# Patient Record
Sex: Male | Born: 1955 | Race: White | Hispanic: No | Smoking: Never smoker
Health system: Southern US, Community
[De-identification: ages and names within clinical notes are randomized; demographics above are authoritative.]

## PROBLEM LIST (undated history)

## (undated) DIAGNOSIS — I10 Essential (primary) hypertension: Secondary | ICD-10-CM

## (undated) DIAGNOSIS — E785 Hyperlipidemia, unspecified: Secondary | ICD-10-CM

## (undated) HISTORY — PX: LAPAROSCOPIC INGUINAL HERNIA REPAIR: SUR788

## (undated) HISTORY — DX: Essential (primary) hypertension: I10

## (undated) HISTORY — DX: Hyperlipidemia, unspecified: E78.5

## (undated) HISTORY — PX: HERNIA REPAIR: SHX51

## (undated) HISTORY — PX: NASAL SINUS SURGERY: SHX719

---

## 2003-04-19 ENCOUNTER — Encounter: Payer: Self-pay | Admitting: Family Medicine

## 2003-04-19 ENCOUNTER — Encounter: Admission: RE | Admit: 2003-04-19 | Discharge: 2003-04-19 | Payer: Self-pay | Admitting: Family Medicine

## 2006-01-25 ENCOUNTER — Encounter: Admission: RE | Admit: 2006-01-25 | Discharge: 2006-01-25 | Payer: Self-pay | Admitting: Family Medicine

## 2006-07-29 ENCOUNTER — Ambulatory Visit: Payer: Self-pay | Admitting: Family Medicine

## 2006-09-06 ENCOUNTER — Ambulatory Visit: Payer: Self-pay | Admitting: Family Medicine

## 2006-09-06 LAB — CONVERTED CEMR LAB
ALT: 18 units/L (ref 0–40)
AST: 23 units/L (ref 0–37)
BUN: 11 mg/dL (ref 6–23)
CO2: 28 meq/L (ref 19–32)
Calcium: 9.4 mg/dL (ref 8.4–10.5)
Chloride: 106 meq/L (ref 96–112)
Chol/HDL Ratio, serum: 5.8
Cholesterol: 204 mg/dL (ref 0–200)
Creatinine, Ser: 1 mg/dL (ref 0.4–1.5)
GFR calc non Af Amer: 84 mL/min
Glomerular Filtration Rate, Af Am: 101 mL/min/{1.73_m2}
Glucose, Bld: 99 mg/dL (ref 70–99)
HDL: 35.4 mg/dL — ABNORMAL LOW (ref >39.0)
LDL DIRECT: 155.9 mg/dL
Potassium: 4.2 meq/L (ref 3.5–5.1)
Sodium: 141 meq/L (ref 135–145)
Triglyceride fasting, serum: 113 mg/dL (ref 0–149)
VLDL: 23 mg/dL (ref 0–40)

## 2006-12-27 ENCOUNTER — Ambulatory Visit: Payer: Self-pay | Admitting: Family Medicine

## 2006-12-27 LAB — CONVERTED CEMR LAB: PSA: 0.88 ng/mL (ref 0.10–4.00)

## 2007-01-13 DIAGNOSIS — I10 Essential (primary) hypertension: Secondary | ICD-10-CM

## 2007-01-13 HISTORY — DX: Essential (primary) hypertension: I10

## 2007-04-18 ENCOUNTER — Ambulatory Visit: Payer: Self-pay | Admitting: Family Medicine

## 2007-04-22 ENCOUNTER — Telehealth (INDEPENDENT_AMBULATORY_CARE_PROVIDER_SITE_OTHER): Payer: Self-pay | Admitting: *Deleted

## 2007-04-22 ENCOUNTER — Encounter (INDEPENDENT_AMBULATORY_CARE_PROVIDER_SITE_OTHER): Payer: Self-pay | Admitting: Family Medicine

## 2007-04-22 DIAGNOSIS — E785 Hyperlipidemia, unspecified: Secondary | ICD-10-CM

## 2007-04-22 HISTORY — DX: Hyperlipidemia, unspecified: E78.5

## 2007-04-22 LAB — CONVERTED CEMR LAB
ALT: 19 units/L (ref 0–53)
AST: 23 units/L (ref 0–37)
Cholesterol: 206 mg/dL (ref 0–200)
Direct LDL: 143.4 mg/dL
HDL: 26.5 mg/dL — ABNORMAL LOW (ref >39.0)
Total CHOL/HDL Ratio: 7.8
Triglycerides: 140 mg/dL (ref 0–149)
VLDL: 28 mg/dL (ref 0–40)

## 2007-05-09 ENCOUNTER — Encounter (INDEPENDENT_AMBULATORY_CARE_PROVIDER_SITE_OTHER): Payer: Self-pay | Admitting: *Deleted

## 2007-05-24 ENCOUNTER — Ambulatory Visit: Payer: Self-pay | Admitting: Family Medicine

## 2007-05-26 ENCOUNTER — Ambulatory Visit: Payer: Self-pay

## 2007-05-30 ENCOUNTER — Encounter (INDEPENDENT_AMBULATORY_CARE_PROVIDER_SITE_OTHER): Payer: Self-pay | Admitting: Family Medicine

## 2007-06-02 ENCOUNTER — Encounter (INDEPENDENT_AMBULATORY_CARE_PROVIDER_SITE_OTHER): Payer: Self-pay | Admitting: Family Medicine

## 2007-07-20 ENCOUNTER — Ambulatory Visit: Payer: Self-pay | Admitting: Family Medicine

## 2007-07-20 ENCOUNTER — Telehealth (INDEPENDENT_AMBULATORY_CARE_PROVIDER_SITE_OTHER): Payer: Self-pay | Admitting: *Deleted

## 2007-07-20 LAB — CONVERTED CEMR LAB
BUN: 10 mg/dL (ref 6–23)
CO2: 31 meq/L (ref 19–32)
Calcium: 9.3 mg/dL (ref 8.4–10.5)
Chloride: 104 meq/L (ref 96–112)
Cholesterol: 168 mg/dL (ref 0–200)
Creatinine, Ser: 0.9 mg/dL (ref 0.4–1.5)
GFR calc Af Amer: 114 mL/min
GFR calc non Af Amer: 95 mL/min
Glucose, Bld: 92 mg/dL (ref 70–99)
HDL: 32.3 mg/dL — ABNORMAL LOW (ref >39.0)
LDL Cholesterol: 113 mg/dL — ABNORMAL HIGH (ref 0–99)
Potassium: 4.1 meq/L (ref 3.5–5.1)
Sodium: 140 meq/L (ref 135–145)
Total CHOL/HDL Ratio: 5.2
Triglycerides: 112 mg/dL (ref 0–149)
VLDL: 22 mg/dL (ref 0–40)

## 2007-07-21 ENCOUNTER — Encounter (INDEPENDENT_AMBULATORY_CARE_PROVIDER_SITE_OTHER): Payer: Self-pay | Admitting: *Deleted

## 2007-07-21 ENCOUNTER — Telehealth (INDEPENDENT_AMBULATORY_CARE_PROVIDER_SITE_OTHER): Payer: Self-pay | Admitting: *Deleted

## 2007-08-22 ENCOUNTER — Telehealth (INDEPENDENT_AMBULATORY_CARE_PROVIDER_SITE_OTHER): Payer: Self-pay | Admitting: *Deleted

## 2007-09-27 ENCOUNTER — Ambulatory Visit: Payer: Self-pay | Admitting: Family Medicine

## 2007-09-28 LAB — CONVERTED CEMR LAB
BUN: 11 mg/dL (ref 6–23)
CO2: 30 meq/L (ref 19–32)
Calcium: 9.6 mg/dL (ref 8.4–10.5)
Chloride: 103 meq/L (ref 96–112)
Cholesterol: 160 mg/dL (ref 0–200)
Creatinine, Ser: 1 mg/dL (ref 0.4–1.5)
GFR calc Af Amer: 101 mL/min
GFR calc non Af Amer: 83 mL/min
Glucose, Bld: 92 mg/dL (ref 70–99)
HDL: 27.6 mg/dL — ABNORMAL LOW (ref >39.0)
LDL Cholesterol: 109 mg/dL — ABNORMAL HIGH (ref 0–99)
Potassium: 4.7 meq/L (ref 3.5–5.1)
Sodium: 139 meq/L (ref 135–145)
Total CHOL/HDL Ratio: 5.8
Triglycerides: 118 mg/dL (ref 0–149)
VLDL: 24 mg/dL (ref 0–40)

## 2007-09-29 ENCOUNTER — Encounter (INDEPENDENT_AMBULATORY_CARE_PROVIDER_SITE_OTHER): Payer: Self-pay | Admitting: *Deleted

## 2007-10-07 ENCOUNTER — Telehealth (INDEPENDENT_AMBULATORY_CARE_PROVIDER_SITE_OTHER): Payer: Self-pay | Admitting: *Deleted

## 2007-10-20 ENCOUNTER — Telehealth (INDEPENDENT_AMBULATORY_CARE_PROVIDER_SITE_OTHER): Payer: Self-pay | Admitting: *Deleted

## 2007-12-20 ENCOUNTER — Telehealth (INDEPENDENT_AMBULATORY_CARE_PROVIDER_SITE_OTHER): Payer: Self-pay | Admitting: *Deleted

## 2008-01-31 ENCOUNTER — Ambulatory Visit: Payer: Self-pay | Admitting: Internal Medicine

## 2008-01-31 DIAGNOSIS — M25519 Pain in unspecified shoulder: Secondary | ICD-10-CM | POA: Insufficient documentation

## 2008-01-31 HISTORY — DX: Pain in unspecified shoulder: M25.519

## 2008-02-06 LAB — CONVERTED CEMR LAB
ALT: 22 units/L (ref 0–53)
BUN: 11 mg/dL (ref 6–23)
Basophils Absolute: 0.1 10*3/uL (ref 0.0–0.1)
Basophils Relative: 1 % (ref 0.0–1.0)
CO2: 31 meq/L (ref 19–32)
Calcium: 9.5 mg/dL (ref 8.4–10.5)
Chloride: 107 meq/L (ref 96–112)
Cholesterol: 171 mg/dL (ref 0–200)
Creatinine, Ser: 0.9 mg/dL (ref 0.4–1.5)
Eosinophils Absolute: 0.3 10*3/uL (ref 0.0–0.7)
Eosinophils Relative: 4.2 % (ref 0.0–5.0)
GFR calc Af Amer: 114 mL/min
GFR calc non Af Amer: 94 mL/min
Glucose, Bld: 101 mg/dL — ABNORMAL HIGH (ref 70–99)
HCT: 45.6 % (ref 39.0–52.0)
HDL: 29.8 mg/dL — ABNORMAL LOW (ref >39.0)
Hemoglobin: 15.7 g/dL (ref 13.0–17.0)
LDL Cholesterol: 120 mg/dL — ABNORMAL HIGH (ref 0–99)
Lymphocytes Relative: 21.7 % (ref 12.0–46.0)
MCHC: 34.4 g/dL (ref 30.0–36.0)
MCV: 92 fL (ref 78.0–100.0)
Monocytes Absolute: 0.5 10*3/uL (ref 0.1–1.0)
Monocytes Relative: 6.5 % (ref 3.0–12.0)
Neutro Abs: 4.8 10*3/uL (ref 1.4–7.7)
Neutrophils Relative %: 66.6 % (ref 43.0–77.0)
PSA: 0.96 ng/mL (ref 0.10–4.00)
Platelets: 257 10*3/uL (ref 150–400)
Potassium: 4.8 meq/L (ref 3.5–5.1)
RBC: 4.95 M/uL (ref 4.22–5.81)
RDW: 12.7 % (ref 11.5–14.6)
Sodium: 142 meq/L (ref 135–145)
TSH: 2.15 microintl units/mL (ref 0.35–5.50)
Total CHOL/HDL Ratio: 5.7
Triglycerides: 105 mg/dL (ref 0–149)
VLDL: 21 mg/dL (ref 0–40)
WBC: 7.3 10*3/uL (ref 4.5–10.5)

## 2008-02-08 ENCOUNTER — Encounter: Admission: RE | Admit: 2008-02-08 | Discharge: 2008-04-03 | Payer: Self-pay | Admitting: Internal Medicine

## 2008-02-09 ENCOUNTER — Encounter: Payer: Self-pay | Admitting: Internal Medicine

## 2008-03-09 ENCOUNTER — Telehealth: Payer: Self-pay | Admitting: Internal Medicine

## 2008-04-03 ENCOUNTER — Encounter: Payer: Self-pay | Admitting: Internal Medicine

## 2008-06-19 ENCOUNTER — Ambulatory Visit: Payer: Self-pay | Admitting: Internal Medicine

## 2008-08-07 ENCOUNTER — Ambulatory Visit: Payer: Self-pay | Admitting: Internal Medicine

## 2009-01-17 ENCOUNTER — Encounter (INDEPENDENT_AMBULATORY_CARE_PROVIDER_SITE_OTHER): Payer: Self-pay | Admitting: *Deleted

## 2009-02-28 ENCOUNTER — Telehealth (INDEPENDENT_AMBULATORY_CARE_PROVIDER_SITE_OTHER): Payer: Self-pay | Admitting: *Deleted

## 2009-04-22 ENCOUNTER — Telehealth (INDEPENDENT_AMBULATORY_CARE_PROVIDER_SITE_OTHER): Payer: Self-pay | Admitting: *Deleted

## 2009-05-31 ENCOUNTER — Ambulatory Visit: Payer: Self-pay | Admitting: Internal Medicine

## 2009-06-03 ENCOUNTER — Encounter (INDEPENDENT_AMBULATORY_CARE_PROVIDER_SITE_OTHER): Payer: Self-pay | Admitting: *Deleted

## 2009-06-18 ENCOUNTER — Ambulatory Visit: Payer: Self-pay | Admitting: Internal Medicine

## 2009-06-24 ENCOUNTER — Encounter (INDEPENDENT_AMBULATORY_CARE_PROVIDER_SITE_OTHER): Payer: Self-pay | Admitting: *Deleted

## 2009-06-24 LAB — CONVERTED CEMR LAB: Fecal Occult Bld: NEGATIVE

## 2009-08-22 ENCOUNTER — Ambulatory Visit: Payer: Self-pay | Admitting: Internal Medicine

## 2009-08-23 ENCOUNTER — Telehealth (INDEPENDENT_AMBULATORY_CARE_PROVIDER_SITE_OTHER): Payer: Self-pay | Admitting: *Deleted

## 2009-11-29 ENCOUNTER — Ambulatory Visit: Payer: Self-pay | Admitting: Internal Medicine

## 2009-11-29 DIAGNOSIS — L989 Disorder of the skin and subcutaneous tissue, unspecified: Secondary | ICD-10-CM | POA: Insufficient documentation

## 2009-11-29 HISTORY — DX: Disorder of the skin and subcutaneous tissue, unspecified: L98.9

## 2009-12-26 ENCOUNTER — Encounter: Payer: Self-pay | Admitting: Internal Medicine

## 2010-04-24 ENCOUNTER — Ambulatory Visit: Payer: Self-pay | Admitting: Internal Medicine

## 2010-04-24 ENCOUNTER — Telehealth (INDEPENDENT_AMBULATORY_CARE_PROVIDER_SITE_OTHER): Payer: Self-pay | Admitting: *Deleted

## 2010-04-25 ENCOUNTER — Encounter: Payer: Self-pay | Admitting: Internal Medicine

## 2010-06-02 ENCOUNTER — Ambulatory Visit: Payer: Self-pay | Admitting: Internal Medicine

## 2010-06-04 LAB — CONVERTED CEMR LAB
ALT: 27 units/L (ref 0–53)
AST: 23 units/L (ref 0–37)
BUN: 15 mg/dL (ref 6–23)
Basophils Absolute: 0 10*3/uL (ref 0.0–0.1)
Basophils Relative: 0.7 % (ref 0.0–3.0)
CO2: 30 meq/L (ref 19–32)
Calcium: 9.6 mg/dL (ref 8.4–10.5)
Chloride: 103 meq/L (ref 96–112)
Cholesterol: 186 mg/dL (ref 0–200)
Creatinine, Ser: 1 mg/dL (ref 0.4–1.5)
Eosinophils Absolute: 0.2 10*3/uL (ref 0.0–0.7)
Eosinophils Relative: 2.7 % (ref 0.0–5.0)
GFR calc non Af Amer: 85.48 mL/min (ref >60)
Glucose, Bld: 96 mg/dL (ref 70–99)
HCT: 44.6 % (ref 39.0–52.0)
HDL: 31.5 mg/dL — ABNORMAL LOW (ref >39.00)
Hemoglobin: 15.6 g/dL (ref 13.0–17.0)
LDL Cholesterol: 128 mg/dL — ABNORMAL HIGH (ref 0–99)
Lymphocytes Relative: 22.9 % (ref 12.0–46.0)
Lymphs Abs: 1.8 10*3/uL (ref 0.7–4.0)
MCHC: 34.9 g/dL (ref 30.0–36.0)
MCV: 92.4 fL (ref 78.0–100.0)
Monocytes Absolute: 0.5 10*3/uL (ref 0.1–1.0)
Monocytes Relative: 6.5 % (ref 3.0–12.0)
Neutro Abs: 5.1 10*3/uL (ref 1.4–7.7)
Neutrophils Relative %: 67.2 % (ref 43.0–77.0)
PSA: 1.23 ng/mL (ref 0.10–4.00)
Platelets: 253 10*3/uL (ref 150.0–400.0)
Potassium: 4.7 meq/L (ref 3.5–5.1)
RBC: 4.82 M/uL (ref 4.22–5.81)
RDW: 13.4 % (ref 11.5–14.6)
Sodium: 141 meq/L (ref 135–145)
TSH: 2.13 microintl units/mL (ref 0.35–5.50)
Total CHOL/HDL Ratio: 6
Triglycerides: 133 mg/dL (ref 0.0–149.0)
VLDL: 26.6 mg/dL (ref 0.0–40.0)
WBC: 7.7 10*3/uL (ref 4.5–10.5)

## 2010-10-19 LAB — CONVERTED CEMR LAB
ALT: 22 units/L (ref 0–53)
AST: 25 units/L (ref 0–37)
BUN: 10 mg/dL (ref 6–23)
Basophils Absolute: 0 10*3/uL (ref 0.0–0.1)
Basophils Relative: 0.1 % (ref 0.0–3.0)
CO2: 30 meq/L (ref 19–32)
Calcium: 9.1 mg/dL (ref 8.4–10.5)
Chloride: 109 meq/L (ref 96–112)
Cholesterol: 176 mg/dL
Creatinine, Ser: 0.9 mg/dL (ref 0.4–1.5)
Eosinophils Absolute: 0.2 10*3/uL (ref 0.0–0.7)
Eosinophils Relative: 2.6 % (ref 0.0–5.0)
GFR calc non Af Amer: 93.55 mL/min (ref >60)
Glucose, Bld: 87 mg/dL
Glucose, Bld: 93 mg/dL (ref 70–99)
HCT: 42.5 % (ref 39.0–52.0)
HDL: 36 mg/dL
Hemoglobin: 14.8 g/dL (ref 13.0–17.0)
Hgb A1c MFr Bld: 5.4 %
LDL Cholesterol: 119 mg/dL
Lymphocytes Relative: 19 % (ref 12.0–46.0)
Lymphs Abs: 1.1 10*3/uL (ref 0.7–4.0)
MCHC: 34.7 g/dL (ref 30.0–36.0)
MCV: 92.3 fL (ref 78.0–100.0)
Monocytes Absolute: 0.4 10*3/uL (ref 0.1–1.0)
Monocytes Relative: 6.9 % (ref 3.0–12.0)
Neutro Abs: 4.2 10*3/uL (ref 1.4–7.7)
Neutrophils Relative %: 71.4 % (ref 43.0–77.0)
PSA: 1.09 ng/mL (ref 0.10–4.00)
Platelets: 222 10*3/uL (ref 150.0–400.0)
Potassium: 4 meq/L (ref 3.5–5.1)
RBC: 4.61 M/uL (ref 4.22–5.81)
RDW: 12.4 % (ref 11.5–14.6)
Sodium: 142 meq/L (ref 135–145)
TSH: 2.07 microintl units/mL (ref 0.35–5.50)
Triglycerides: 107 mg/dL
WBC: 5.9 10*3/uL (ref 4.5–10.5)

## 2010-10-21 NOTE — Assessment & Plan Note (Signed)
Summary: 6 MONTH OV//PH   Vital Signs:  Patient profile:   55 year old male Weight:      166 pounds Pulse rate:   70 / minute BP sitting:   128 / 90  (left arm)  Vitals Entered By: Doristine Devoid (November 29, 2009 3:30 PM) CC: 6 month roa    History of Present Illness: ROV noted a couple of bumps in the scalp x a while  also has few skin lesions at his face and L eye ; has noted some irritation in the  lesions close to his left eye  Allergies: 1)  ! Lipitor 2)  ! Niacin  Past History:  Past Medical History: Reviewed history from 01/13/2007 and no changes required. Hyperlipidemia Hypertension  Past Surgical History: Reviewed history from 05/31/2009 and no changes required. s/p sinus surgery (remotely) B hernia repair in the 90s   Social History: Reviewed history from 05/31/2009 and no changes required. Married 3 kids from [place] Never Smoked Alcohol use-yes (2 glasses of wine/ 3 beers/wk) Regular exercise-yes (2-3x/wk) Drug use-no Occupation: Insurance risk surveyor   Review of Systems       no ambulatory BPs  good medication compliance w / zetia  CV:  Denies chest pain or discomfort and swelling of feet. Resp:  Denies cough and shortness of breath.  Physical Exam  General:  alert and well-developed.   Head:  has a   3-mm under the skin induration in the scalp, skin over this lesion is normal. similar  5-mm finding in the back of the neck Lungs:  normal respiratory effort, no intercostal retractions, no accessory muscle use, and normal breath sounds.   Heart:  normal rate, regular rhythm, no murmur, and no gallop.   Skin:  several maculopapular, light brown lesions close to his left eye and scalp   Impression & Recommendations:  Problem # 1:  SKIN LESION (ICD-709.9)  several skin lesions in the scalp and face previously he had some lesions removed one of the lesions by the left eye has been irritated according to the patient Plan: Dermatology referral  he  also has two s.q. indurations, one on the scalp, one on the neck.  Most likely they are lipomas/ fibromas , the skin over these lesions is normal. Plan: observation, patient will call if anything changes  Orders: Dermatology Referral (Derma)  Problem # 2:  HYPERTENSION (ICD-401.9) diastolic BP is slightly elevated today, no ambulatory BPs. No change in this point His updated medication list for this problem includes:    Atenolol 50 Mg Tabs (Atenolol) .Marland Kitchen... Take one tablet daily    Ramipril 5 Mg Caps (Ramipril) .Marland Kitchen... Take one tablet daily  BP today: 128/90 Prior BP: 132/78 (05/31/2009)  Labs Reviewed: K+: 4.0 (05/31/2009) Creat: : 0.9 (05/31/2009)   Chol: 176 (05/09/2009)   HDL: 36 (05/09/2009)   LDL: 119 (05/09/2009)   TG: 107 (05/09/2009)  Problem # 3:  HYPERLIPIDEMIA (ICD-272.4) continue with zetia  His updated medication list for this problem includes:    Zetia 10 Mg Tabs (Ezetimibe) .Marland Kitchen... 1 by mouth qd  Labs Reviewed: SGOT: 25 (05/31/2009)   SGPT: 22 (05/31/2009)   HDL:36 (05/09/2009), 29.8 (01/31/2008)  LDL:119 (05/09/2009), 120 (01/31/2008)  Chol:176 (05/09/2009), 171 (01/31/2008)  Trig:107 (05/09/2009), 105 (01/31/2008)  Complete Medication List: 1)  Atenolol 50 Mg Tabs (Atenolol) .... Take one tablet daily 2)  Ramipril 5 Mg Caps (Ramipril) .... Take one tablet daily 3)  Adult Aspirin Ec Low Strength 81 Mg Tbec (Aspirin) 4)  Bl Flax Seed Oil 1000 Mg Caps (Flaxseed (linseed)) 5)  Zetia 10 Mg Tabs (Ezetimibe) .Marland Kitchen.. 1 by mouth qd  Patient Instructions: 1)  Please schedule a follow-up appointment in 6 months (fasting, physical exam) Prescriptions: ZETIA 10 MG TABS (EZETIMIBE) 1 by mouth qd  #90 x 3   Entered and Authorized by:   Nolon Rod. Nikola Blackston MD   Signed by:   Nolon Rod. Shenicka Sunderlin MD on 11/29/2009   Method used:   Faxed to ...       [facility] Fax) (mail-order)             ,          Ph: [phone]       Fax: [phone]   RxID:   [id number] RAMIPRIL 5  MG  CAPS (RAMIPRIL) TAKE ONE TABLET DAILY  #90 x 3   Entered and Authorized by:   Nolon Rod. Dorthey Depace MD   Signed by:   Nolon Rod. Millianna Szymborski MD on 11/29/2009   Method used:   Faxed to ...       [facility] Fax) (mail-order)             ,          Ph: [phone]       Fax: [phone]   RxID:   [id number] ATENOLOL 50 MG  TABS (ATENOLOL) Take one tablet daily  #90 x 3   Entered and Authorized by:   Nolon Rod. Mana Morison MD   Signed by:   Nolon Rod. Campbell Kray MD on 11/29/2009   Method used:   Faxed to ...       [facility] Delivery Fax) (mail-order)             ,          Ph: [phone]       Fax: [phone]   RxID:   [id number]

## 2010-10-21 NOTE — Assessment & Plan Note (Signed)
Summary: 2 MON OV FROM LABS VISIT   PH  Medications Added RAMIPRIL 5 MG  CAPS (RAMIPRIL) TAKE ONE TABLET DAILY ADULT ASPIRIN EC LOW STRENGTH 81 MG  TBEC (ASPIRIN)  BL FLAX SEED OIL 1000 MG  CAPS (FLAXSEED (LINSEED))  ZETIA 10 MG TABS (EZETIMIBE) 1 by mouth qd      Allergies Added: ! LIPITOR ! NIACIN  Vital Signs:  Patient Profile:   55 Years Old Male Weight:      166.38 pounds Pulse rate:   68 / minute Resp:     14 per minute BP sitting:   118 / 78  (left arm)  Pt. in pain?   no  Vitals Entered By: Ardyth Man (May 24, 2007 9:02 AM)                  Chief Complaint:  High Cholesterol and discuss labs.  History of Present Illness:  Hypertension Follow-Up      This is a 55 year old man who presents for Hypertension follow-up.  The patient denies headaches and edema.  The patient denies the following associated symptoms: chest pain, dyspnea, and leg edema.  Compliance with medications (by patient report) has been near 100%.  The patient reports that dietary compliance has been good.  The patient reports exercising occasionally.    Regarding his hyperlipidemia, patient has tried over-the-counter medications with no significant improvement in his LDL.  He states he is willing to try Zetia at this point.  Patient also reports that he has small black lines underneath his left thumbnail.  He stated he has a similar 3 years ago in Dr. Modesto Charon ordered an echocardiogram which was unremarkable.  Patient denies any trauma to the finger, but he does use his hands a lot.  Most recently he states he was working on his car.  He reports that the lines have been present for 3 to 4 weeks.  Patient denies any history of heart murmur, rheumatic heart disease, or history of endocarditis.He denies any fevers chills or shortness of breath.  Current Allergies: ! LIPITOR ! NIACIN      Physical Exam  General:     Well-developed,well-nourished,in no acute distress; alert,appropriate  and cooperative throughout examination Neck:     No deformities, masses, or tenderness noted. Lungs:     Normal respiratory effort, chest expands symmetrically. Lungs are clear to auscultation, no crackles or wheezes. Heart:     Normal rate and regular rhythm. S1 and S2 normal without gallop, murmur, click, rub or other extra sounds. Pulses:     R and L carotid,radial,dorsalis pedis and posterior tibial pulses are full and equal bilaterally Extremities:     left thumb; very small 0.5 to 0.2 mm linear black lesions numbering at 3 underneath the nail.  Examination of the rest of the fingers and toes are unremarkable.    Impression & Recommendations:  Problem # 1:  HYPERTENSION (ICD-401.9) stable His updated medication list for this problem includes:    Atenolol 50 Mg Tabs (Atenolol) .Marland Kitchen... Take one tablet daily    Ramipril 5 Mg Caps (Ramipril) .Marland Kitchen... Take one tablet daily  BP today: 118/78  Labs Reviewed: Creat: 1.0 (09/06/2006) Chol: 206 (04/18/2007)   HDL: 26.5 (04/18/2007)   LDL: DEL (04/18/2007)   TG: 140 (04/18/2007)   Problem # 2:  HYPERLIPIDEMIA (ICD-272.4) the patient agreed to start the medication.  Side effects were reviewed.   His updated medication list for this problem includes:  Zetia 10 Mg Tabs (Ezetimibe) .Marland Kitchen... 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  Unlikely to be petechial hemorrhages but will obtain the echocardiogram for further assessment. If unremarkable, will refer to dermatology for further recommendations given recurrence.  Complete Medication List: 1)  Atenolol 50 Mg Tabs (Atenolol) .... Take one tablet daily 2)  Ramipril 5 Mg Caps (Ramipril) .... Take one tablet daily 3)  Adult Aspirin Ec Low Strength 81 Mg Tbec (Aspirin) 4)  Bl Flax Seed Oil 1000 Mg Caps (Flaxseed (linseed)) 5)  Zetia 10 Mg Tabs (Ezetimibe) .Marland Kitchen.. 1 by mouth qd  Other Orders: Cardiology Referral  (Cardiology)   Patient Instructions: 1)  Please return for a FASTING Lipid Profile and BMET in 8 weeks. 272.4 2)  Please schedule a follow-up appointment in 4 months.    Prescriptions: ATENOLOL 50 MG  TABS (ATENOLOL) Take one tablet daily  #30 x 3   Entered and Authorized by:   Leanne Chang MD   Signed by:   Leanne Chang MD on 05/24/2007   Method used:   [facility] to ...       [id number] [address]       [address]       [place], [place]  [address]       Ph: [phone] or [phone]       Fax: [phone]   RxID:   [id number] RAMIPRIL 5 MG  CAPS (RAMIPRIL) TAKE ONE TABLET DAILY  #30 x 3   Entered and Authorized by:   Leanne Chang MD   Signed by:   Leanne Chang MD on 05/24/2007   Method used:   Electronically sent to ...       [id number] [address]       [address]       [place], [place]  [address]       Ph: [phone] or [phone]       Fax: [phone]   RxID:   [id number] ZETIA 10 MG TABS (EZETIMIBE) 1 by mouth qd  #30 x 3   Entered and Authorized by:   Leanne Chang MD   Signed by:   Leanne Chang MD on 05/24/2007   Method used:   Electronically sent to ...       [id number] [address]       [address]       [place], [place]  [address]       Ph: [phone] or [phone]       Fax: [phone]   RxID:   [id number]

## 2010-10-21 NOTE — Letter (Signed)
Summary: Primary Care Appointment Letter  [facility] [place]  [address] [place], [place] [address]   Phone: [phone]  Fax: [phone]    01/17/2009 MRN: [id number]  Seth Baker [address] [place], [place]  [address]  Dear [name],   Your Primary Care Physician Daniel E. Paz MD has indicated that:    _______it is time to schedule an appointment.    _______you missed your appointment on______ and need to call and          reschedule.    _______you need to have lab work done.    _______you need to schedule an appointment discuss lab or test results.    __X____we reschedule your appointment that is                       scheduled on 02-04-09 to 02-27-09 @10 :20am.     Please call our office as soon as possible if you are unable to make this appointment. Our phone number is [phone]. Please press option 1. Our office is open 8a-12noon and 1p-5p, Monday through Friday.     Thank you,    [facility]

## 2010-10-21 NOTE — Letter (Signed)
Summary: Results Follow-up Letter  [place] [facility]  [address] [place], [place] [address]   Phone: [phone]  Fax: [phone]    07/21/2007        Seth Baker [address] [place], [place]  [address]  Dear Mr. CLUGSTON,   The following are the results of your recent test(s):  Test     Result     Pap Smear    Normal_______  Not Normal_____       Comments: _________________________________________________________ Cholesterol LDL(Bad cholesterol):          Your goal is less than:         HDL (Good cholesterol):        Your goal is more than: _________________________________________________________ Other Tests:   _________________________________________________________  Please call for an appointment Or __Please see attached._______________________________________________________ _________________________________________________________ _________________________________________________________  Sincerely,  Ardyth Man [place] at [facility]

## 2010-10-21 NOTE — Progress Notes (Signed)
Summary: fyi - referral to ortho  Phone Note Call from Patient Call back at Home Phone [phone] Call back at [phone]   Caller: Patient Reason for Call: Talk to Nurse Summary of Call: PT IS CALLING SAYING  7 OUT OF 8 THERAPY HIS SHOULDERIS NOT GETTING ANY BETTER HE NEED AN MRI OF THE BONE DONE OR AND ORTHO REFERRAL Initial call taken by: Freddy Jaksch,  March 09, 2008 10:26 AM  Follow-up for Phone Call        hm # d/c [phone] - mailbox full .........Marland KitchenShary Decamp  March 09, 2008 3:27 PM  PT recommened that patient have ortho eval for shoulder pain referral done ..........Marland KitchenShary Decamp  March 12, 2008 10:42 AM agree..........Jose E. Paz MD  March 12, 2008 12:35 PM

## 2010-10-21 NOTE — Progress Notes (Signed)
Summary: [facility] [employer] [facility] PAPERWORK   Phone Note Call from Patient   Caller: Patient Summary of Call: PAPERWORK FOR PHYSICIANS RESULTS REPORT FORM--WELLNESS PROGRAM FOR EMPLOYER = [facility] (FAX [phone])  WILL ATTACH BILLING SHEET AND TAKE BACK TO DANIELLE'S DESK IN PLASTIC SLEEVE  PLEASE BRING ALL COMPLETED PAPERWORK AND BILLING SHEET BACK TO [facility] Initial call taken by: Jerolyn Shin,  April 24, 2010 12:11 PM  Follow-up for Phone Call        Pt had CPX 05/2009. I completed what I could. He has pending CPX in 05/2010. Army Fossa CMA  April 24, 2010 12:44 PM  done [place] E. Paz MD  April 25, 2010 10:37 AM   Additional Follow-up for Phone Call Additional follow up Details #1::        RECEIVED PAPEWORK FROM [facility]  FAXED "OK" --04/25/2010  SENT PAPER TO BE SCANNED, MAILED COPY TO PATIENT Additional Follow-up by: Jerolyn Shin,  April 25, 2010 2:52 PM

## 2010-10-21 NOTE — Assessment & Plan Note (Signed)
Summary: roa 4 months,cbs   Vital Signs:  Patient Profile:   55 Years Old Male Weight:      166 pounds Temp:     98.0 degrees F oral Pulse rate:   68 / minute Resp:     14 per minute BP sitting:   122 / 80  (right arm)  Pt. in pain?   no  Vitals Entered By: Ardyth Man (September 27, 2007 8:58 AM)                  Chief Complaint:  4 month BP Check.  History of Present Illness:  Hypertension Follow-Up      This is a 55 year old man who presents for Hypertension follow-up.  The patient denies lightheadedness, urinary frequency, headaches, edema, rash, and fatigue.  The patient denies the following associated symptoms: chest pain, exercise intolerance, syncope, leg edema, and pedal edema.  Compliance with medications (by patient report) has been near 100%.  The patient reports that dietary compliance has been good.  The patient reports exercising occasionally.    Hyperlipidemia Follow-Up      The patient also presents for Hyperlipidemia follow-up.  The patient denies the following symptoms: chest pain/pressure and dypsnea.  Compliance with medications (by patient report) has been near 100%.    Lesion under nail resolved.  Current Allergies: ! LIPITOR ! NIACIN      Physical Exam  General:     Well-developed,well-nourished,in no acute distress; alert,appropriate and cooperative throughout examination Neck:     No deformities, masses, or tenderness noted. Lungs:     Normal respiratory effort, chest expands symmetrically. Lungs are clear to auscultation, no crackles or wheezes. Heart:     Normal rate and regular rhythm. S1 and S2 normal without gallop, murmur, click, rub or other extra sounds. Pulses:     R and L carotid,radial ,dorsalis pedis and posterior tibial pulses are full and equal bilaterally Extremities:     No clubbing, cyanosis, edema, or deformity noted with normal full range of motion of all joints.      Impression & Recommendations:  Problem #  1:  HYPERTENSION (ICD-401.9) At goal The following medications were removed from the medication list:    Altace 5 Mg Caps (Ramipril)  His updated medication list for this problem includes:    Atenolol 50 Mg Tabs (Atenolol) .Marland Kitchen... Take one tablet daily    Ramipril 5 Mg Caps (Ramipril) .Marland Kitchen... Take one tablet daily  Orders: TLB-Lipid Panel (80061-LIPID) TLB-BMP (Basic Metabolic Panel-BMET) (80048-METABOL) Venipuncture ([address])  BP today: 122/80 Prior BP: 118/78 (05/24/2007)  Labs Reviewed: Creat: 0.9 (07/20/2007) Chol: 168 (07/20/2007)   HDL: 32.3 (07/20/2007)   LDL: 113 (07/20/2007)   TG: 112 (07/20/2007)   Problem # 2:  HYPERLIPIDEMIA (ICD-272.4) Previously at goal F/u for CPE in 4 months and as needed. His updated medication list for this problem includes:    Zetia 10 Mg Tabs (Ezetimibe) .Marland Kitchen... 1 by mouth qd  Labs Reviewed: Chol: 168 (07/20/2007)   HDL: 32.3 (07/20/2007)   LDL: 113 (07/20/2007)   TG: 112 (07/20/2007) SGOT: 23 (04/18/2007)   SGPT: 19 (04/18/2007)   Complete Medication List: 1)  Atenolol 50 Mg Tabs (Atenolol) .... Take one tablet daily 2)  Ramipril 5 Mg Caps (Ramipril) .... Take one tablet daily 3)  Adult Aspirin Ec Low Strength 81 Mg Tbec (Aspirin) 4)  Bl Flax Seed Oil 1000 Mg Caps (Flaxseed (linseed)) 5)  Zetia 10 Mg Tabs (Ezetimibe) .Marland Kitchen.. 1 by mouth  qd     ]

## 2010-10-21 NOTE — Letter (Signed)
Summary: Results Follow-up Letter  [place] [facility]  [address] [place], [place] [address]   Phone: [phone]  Fax: [phone]    06/02/2007        Elier Berninger [address] [place], [place]  [address]  Dear Mr. KYNE,   The following are the results of your recent test(s):  Test     Result     Pap Smear    Normal_______  Not Normal_____       Comments: _________________________________________________________ Cholesterol LDL(Bad cholesterol):          Your goal is less than:         HDL (Good cholesterol):        Your goal is more than: _________________________________________________________ Other Tests:   _________________________________________________________  Please call for an appointment Or ___SEE ATTACHED.______________________________________________________ _________________________________________________________ _________________________________________________________  Sincerely,  Ardyth Man [place] at [facility]

## 2010-10-21 NOTE — Letter (Signed)
Summary: Results Follow-up Letter  [place] at [facility]  [address] [place], [place] [address]   Phone: [phone]  Fax: [phone]    09/29/2007        Seth Baker [address] [place], [place]  [address]  Dear [name],   The following are the results of your recent test(s):  Test     Result     Pap Smear    Normal_______  Not Normal_____       Comments: _________________________________________________________ Cholesterol LDL(Bad cholesterol):          Your goal is less than:         HDL (Good cholesterol):        Your goal is more than: _________________________________________________________ Other Tests:   _________________________________________________________  Please call for an appointment Or _Please see attached.________________________________________________________ _________________________________________________________ _________________________________________________________  Sincerely,  Ardyth Man [place] at [facility]

## 2010-10-21 NOTE — Consult Note (Signed)
Summary: Duard Larsen MD Dermatology  Duard Larsen MD Dermatology   Imported By: Lanelle Bal 01/09/2010 10:35:16  _____________________________________________________________________  External Attachment:    Type:   Image     Comment:   External Document

## 2010-10-21 NOTE — Assessment & Plan Note (Signed)
Summary: roa 6 months/cbs   Vital Signs:  Patient Profile:   55 Years Old Male Height:     68.75 inches Weight:      166.6 pounds Pulse rate:   60 / minute Pulse rhythm:   regular BP sitting:   132 / 84  (left arm) Cuff size:   regular  Vitals Entered By: [name] (August 07, 2008 9:08 AM)                 Chief Complaint:  rov - fasting.  History of Present Illness: ROV feels well occ. has a 3 second "rush", "like a hot flash" (see ROS) has spots in his nails sometimes     Updated Prior Medication List: ATENOLOL 50 MG  TABS (ATENOLOL) Take one tablet daily RAMIPRIL 5 MG  CAPS (RAMIPRIL) TAKE ONE TABLET DAILY ADULT ASPIRIN EC LOW STRENGTH 81 MG  TBEC (ASPIRIN)  BL FLAX SEED OIL 1000 MG  CAPS (FLAXSEED (LINSEED))  ZETIA 10 MG TABS (EZETIMIBE) 1 by mouth qd  Current Allergies (reviewed today): ! LIPITOR ! NIACIN  Past Medical History:    Reviewed history from 01/13/2007 and no changes required:       Hyperlipidemia       Hypertension  Past Surgical History:    Reviewed history from 01/31/2008 and no changes required:       s/p sinus surgery (remotely)   Social History:    Reviewed history from 01/31/2008 and no changes required:       Married       3 kids       from [place]    Review of Systems       denies CP-Palpitations -SOB has not improved exercise "have no time" (see last FLP)   Physical Exam  General:     alert and well-developed.   Lungs:     normal respiratory effort, no intercostal retractions, no accessory muscle use, and normal breath sounds.   Heart:     normal rate, regular rhythm, and no murmur.   Extremities:     no pretibial edema bilaterally  Skin:     R 1st finger nail: has a minute (<55mm) dark dot     Impression & Recommendations:  Problem # 1:  HYPERTENSION (ICD-401.9) at goal  His updated medication list for this problem includes:    Atenolol 50 Mg Tabs (Atenolol) .Marland Kitchen... Take one tablet daily    Ramipril  5 Mg Caps (Ramipril) .Marland Kitchen... Take one tablet daily  BP today: 132/84 Prior BP: 140/80 (01/31/2008)  Labs Reviewed: Creat: 0.9 (01/31/2008) Chol: 171 (01/31/2008)   HDL: 29.8 (01/31/2008)   LDL: 120 (01/31/2008)   TG: 105 (01/31/2008)   Problem # 2:  HYPERLIPIDEMIA (ICD-272.4) well control, HDL slightly  low His updated medication list for this problem includes:    Zetia 10 Mg Tabs (Ezetimibe) .Marland Kitchen... 1 by mouth qd   Problem # 3:  other issues --minute nail spot: has them on-off, observe --hot flashes? observe (no associates red flag symptoms )  Complete Medication List: 1)  Atenolol 50 Mg Tabs (Atenolol) .... Take one tablet daily 2)  Ramipril 5 Mg Caps (Ramipril) .... Take one tablet daily 3)  Adult Aspirin Ec Low Strength 81 Mg Tbec (Aspirin) 4)  Bl Flax Seed Oil 1000 Mg Caps (Flaxseed (linseed)) 5)  Zetia 10 Mg Tabs (Ezetimibe) .Marland Kitchen.. 1 by mouth qd   Patient Instructions: 1)  Please schedule a PHYSICAL  in 6 months (May 2010)   ]

## 2010-10-21 NOTE — Progress Notes (Signed)
Summary: ALEJANDRO-REFILL ATENOLOL 50MG   Phone Note Refill Request   Refills Requested: Medication #1:  ATENOLOL 50 MG  TABS Take one tablet daily RECD BY [id number] [address] [phone] LAST FILLED #30 ON 10.28.08  Initial call taken by: Gwen Pounds,  August 22, 2007 11:30 AM      Prescriptions: ATENOLOL 50 MG  TABS (ATENOLOL) Take one tablet daily  #30 x 1   Entered by:   Ardyth Man   Authorized by:   Leanne Chang MD   Signed by:   Ardyth Man on 08/22/2007   Method used:   [facility] ...       [facility]  [address] [id number]       [address]       [place], [place]  [address]       Ph: [phone] or [phone]       Fax: [phone]   RxID:   [id number]

## 2010-10-21 NOTE — Assessment & Plan Note (Signed)
Summary: FLU SHOT///SP[facility]   Nurse Visit   Allergies: 1)  ! Lipitor 2)  ! Niacin  Orders Added: 1)  Admin 1st Vaccine [[address]] 2)  Flu Vaccine 10yrs + [[address]] Flu Vaccine Consent Questions     Do you have a history of severe allergic reactions to this vaccine? no    Any prior history of allergic reactions to egg and/or gelatin? no    Do you have a sensitivity to the preservative Thimersol? no    Do you have a past history of Guillan-Barre Syndrome? no    Do you currently have an acute febrile illness? no    Have you ever had a severe reaction to latex? no    Vaccine information given and explained to patient? yes    Are you currently pregnant? no    Lot Number:[id number]   Exp Date:03/20/2010   Site Given right Deltoid IM .lbflu

## 2010-10-21 NOTE — Progress Notes (Signed)
Summary: THREE **90 DAY** PRESCRIPTIONS  Phone Note Call from Patient Call back at Home Phone [phone]   Caller: Patient Summary of Call: PATIENT DROPPED OFF THREE [facility] PAPERWORK FOR:  1)  RAMIPRIL 5MG    ----TAKE ONE CAPSULE DAILY 2)  ATENOLOL 50 MG   ----TAKE ONE TABLET DAILY 3)  ZETIA 10 MG   ----TAKE ONE TABLET BY MOUTH EVERY DAY  *****NEEDS  90 DAY PRESCRIPTIONS PLUS REFILLS****  WILL GIVE THREE PAGES TO ALIDA Initial call taken by: Jerolyn Shin,  April 22, 2009 3:56 PM  Follow-up for Phone Call        PT HAS CPX SCHEDULED FOR 05/31/09 Follow-up by: Kandice Hams,  April 22, 2009 4:33 PM    Prescriptions: ZETIA 10 MG TABS (EZETIMIBE) 1 by mouth qd  #90 x 0   Entered by:   [name]   Authorized by:   Nolon Rod. Paz MD   Signed by:   [name] on 04/23/2009   Method used:   Faxed to ...       [facility] Fax) (mail-order)             ,          Ph: [phone]       Fax: [phone]   RxID:   [id number] RAMIPRIL 5 MG  CAPS (RAMIPRIL) TAKE ONE TABLET DAILY  #90 x 0   Entered by:   [name]   Authorized by:   Nolon Rod. Paz MD   Signed by:   [name] on 04/23/2009   Method used:   Faxed to ...       [facility] Delivery Fax) (mail-order)             ,          Ph: [phone]       Fax: [phone]   RxID:   [id number] ATENOLOL 50 MG  TABS (ATENOLOL) Take one tablet daily  #90 x 0   Entered by:   [name]   Authorized by:   Nolon Rod. Paz MD   Signed by:   [name] on 04/23/2009   Method used:   Faxed to ...       [facility] Fax) (mail-order)             ,          Ph: [phone]       Fax: [phone]   RxID:   [id number]   Appended Document: THREE **90 DAY** PRESCRIPTIONS FAX # IS [phone]

## 2010-10-21 NOTE — Letter (Signed)
Summary: [employer] [facility] Form/[facility]   Imported By: Lanelle Bal 05/05/2010 13:30:12  _____________________________________________________________________  External Attachment:    Type:   Image     Comment:   External Document

## 2010-10-21 NOTE — Progress Notes (Signed)
Summary: alej--refill  Phone Note Refill Request   Refills Requested: Medication #1:  ATENOLOL 50 MG  TABS Take one tablet daily [facility] [address] [phone] faxs- [phone]  Initial call taken by: Freddy Jaksch,  October 20, 2007 4:26 PM      Prescriptions: ATENOLOL 50 MG  TABS (ATENOLOL) Take one tablet daily  #30 x 1   Entered by:   Ardyth Man   Authorized by:   Leanne Chang MD   Signed by:   Ardyth Man on 10/20/2007   Method used:   Electronically sent to ...       [facility]  [address] [id number]       [address]       [place], [place]  [address]       Ph: [phone] or [phone]       Fax: [phone]   RxID:   [id number]

## 2010-10-21 NOTE — Progress Notes (Signed)
Summary: ALEJANDRO-REFILL RAMIPRIL 5MG   Medications Added ALTACE 5 MG CAPS (RAMIPRIL)        Phone Note Refill Request   Refills Requested: Medication #1:  RAMIPRIL 5 MG  CAPS TAKE ONE TABLET DAILY RECD BY FAX [id number] [address] [phone] LAST FILLED #30 ON 10.01.08  Initial call taken by: Gwen Pounds,  July 20, 2007 2:49 PM    New/Updated Medications: ALTACE 5 MG CAPS (RAMIPRIL)    Prescriptions: RAMIPRIL 5 MG  CAPS (RAMIPRIL) TAKE ONE TABLET DAILY  #30 x 3   Entered by:   Ardyth Man   Authorized by:   Leanne Chang MD   Signed by:   Ardyth Man on 07/20/2007   Method used:   Electronically sent to ...       [facility]  [address] [id number]       [address]       [place], [place]  [address]       Ph: [phone] or [phone]       Fax: [phone]   RxID:   [id number]

## 2010-10-21 NOTE — Assessment & Plan Note (Signed)
Summary: cpx/ns/kdc   Vital Signs:  Patient profile:   55 year old male Height:      68.75 inches Weight:      167.25 pounds BMI:     24.97 Pulse rate:   57 / minute Pulse rhythm:   regular BP sitting:   128 / 80  (left arm) Cuff size:   regular  Vitals Entered By: Army Fossa CMA (June 02, 2010 8:06 AM) CC: CPX, fasting Comments [facility] flu shot   History of Present Illness: CPX  for the last week he has noted  some itching in the posterior aspect of the neck  Current Medications (verified): 1)  Atenolol 50 Mg  Tabs (Atenolol) .... Take One Tablet Daily 2)  Ramipril 5 Mg  Caps (Ramipril) .... Take One Tablet Daily 3)  Adult Aspirin Ec Low Strength 81 Mg  Tbec (Aspirin) 4)  Bl Flax Seed Oil 1000 Mg  Caps (Flaxseed (Linseed)) 5)  Zetia 10 Mg Tabs (Ezetimibe) .Marland Kitchen.. 1 By Mouth Qd  Allergies (verified): 1)  ! Lipitor 2)  ! Niacin  Past History:  Past Medical History: Reviewed history from 01/13/2007 and no changes required. Hyperlipidemia Hypertension  Past Surgical History: Reviewed history from 05/31/2009 and no changes required. s/p sinus surgery (remotely) B hernia repair in the 90s   Family History: Reviewed history from 01/31/2008 and no changes required. CAD - no HTN - bro DM - no stroke - no colon ca - no prostate ca - no  Social History: Reviewed history from 05/31/2009 and no changes required. Married 3 kids from [place] Never Smoked Alcohol use-yes (2 glasses of wine/ 3 beers/wk) Regular exercise-yes (2-3x/wk) Drug use-no Occupation: Insurance risk surveyor   Review of Systems CV:  Denies chest pain or discomfort, palpitations, and swelling of feet. Resp:  Denies cough, shortness of breath, and wheezing. GI:  Denies bloody stools, diarrhea, nausea, and vomiting. GU:  Denies dysuria and urinary hesitancy; some frecuency no nocturia . Psych:  Denies anxiety and depression.  Physical Exam  General:  alert, well-developed,  and well-nourished.   Neck:  no thyromegaly and normal carotid upstroke.   Lungs:  normal respiratory effort, no intercostal retractions, no accessory muscle use, and normal breath sounds.   Heart:  normal rate, regular rhythm, no murmur, and no gallop.   Abdomen:  soft, non-tender, no distention, no masses, no guarding, and no rigidity.   Rectal:  No external abnormalities noted. Normal sphincter tone. No rectal masses or tenderness. Prostate:  Prostate gland firm and smooth, no enlargement, nodularity, tenderness, mass, asymmetry or induration. Extremities:  no lower extremity edema Neurologic:  alert & oriented X3, strength normal in all extremities, and gait normal.   Skin:  posterior neck slightly red. Psych:  Cognition and judgment appear intact. Alert and cooperative with normal attention span and concentration.  not anxious appearing and not depressed appearing.     Impression & Recommendations:  Problem # 1:  HEALTH SCREENING (ICD-V70.0) chart reviewed  Td 2002 virtual colonoscopy 5-07: neg, Hemoccult negative last year. Labs continue with his healthy lifestyle   Orders: Venipuncture (30865) TLB-Lipid Panel (80061-LIPID) TLB-BMP (Basic Metabolic Panel-BMET) (80048-METABOL) TLB-CBC Platelet - w/Differential (85025-CBCD) TLB-PSA (Prostate Specific Antigen) (84153-PSA) TLB-TSH (Thyroid Stimulating Hormone) (84443-TSH) TLB-ALT (SGPT) (84460-ALT) TLB-AST (SGOT) (84450-SGOT) Specimen Handling (78469)  Problem # 2:  SKIN LESION (ICD-709.9) patient reports he saw dermatology, he took some skin lesions from the face. He has an ill-defined itching in the posterior neck, trial with steroids  Complete  Medication List: 1)  Atenolol 50 Mg Tabs (Atenolol) .... Take one tablet daily 2)  Ramipril 5 Mg Caps (Ramipril) .... Take one tablet daily 3)  Adult Aspirin Ec Low Strength 81 Mg Tbec (Aspirin) 4)  Bl Flax Seed Oil 1000 Mg Caps (Flaxseed (linseed)) 5)  Zetia 10 Mg Tabs  (Ezetimibe) .Marland Kitchen.. 1 by mouth qd 6)  Hydrocortisone 2.5 % Crea (Hydrocortisone) .... Apply twice a day for one week  Other Orders: Admin 1st Vaccine (69485) Flu Vaccine 14yrs + (46270)   Patient Instructions: 1)  Please schedule a follow-up appointment in 6 months .  Flu Vaccine Consent Questions     Do you have a history of severe allergic reactions to this vaccine? no    Any prior history of allergic reactions to egg and/or gelatin? no    Do you have a sensitivity to the preservative Thimersol? no    Do you have a past history of Guillan-Barre Syndrome? no    Do you currently have an acute febrile illness? no    Have you ever had a severe reaction to latex? no    Vaccine information given and explained to patient? yes    Are you currently pregnant? no    Lot Number:[id number]   Exp Date:03/21/2011   Site Given  Left Deltoid IMPrescriptions: HYDROCORTISONE 2.5 % CREA (HYDROCORTISONE) apply twice a day for one week  #1 x 0   Entered and Authorized by:   Nolon Rod. Salwa Bai MD   Signed by:   Nolon Rod. Rishi Vicario MD on 06/02/2010   Method used:   Print then Give to Patient   RxID:   [id number]     .lbflu

## 2010-10-21 NOTE — Letter (Signed)
[facility]   Imported By: Freddy Jaksch 02/10/2008 09:10:35  _____________________________________________________________________  External Attachment:    Type:   Image     Comment:   External Document

## 2010-10-21 NOTE — Letter (Signed)
Summary: [facility]: Immunoassay Fecal Occult Blood (iFOB) Order Form  [facility] [place]  [address] [place], [place] [address]   Phone: [phone]  Fax: [phone]       Lab: Immunoassay Fecal Occult Blood (iFOB) Order Form   June 03, 2009 MRN: [id number]   Seth Baker 04-Jul-1956   Physicican Name:_______paz________________  Diagnosis Code:_________v76.51_________________      [name]

## 2010-10-21 NOTE — Assessment & Plan Note (Signed)
Summary: cpx//ph   Vital Signs:  Patient profile:   55 year old male Height:      68.75 inches Weight:      165.8 pounds BMI:     24.75 Pulse rate:   60 / minute Pulse rhythm:   regular BP sitting:   132 / 78 Cuff size:   regular  Vitals Entered By: [name] (May 31, 2009 9:19 AM) CC: cpx - fasting Is Patient Diabetic? No Comments  - c/o low back pain off & on x few years [facility]  May 31, 2009 9:22 AM    History of Present Illness: cpx - fasting  - c/o mild low back pain off & on x few years, usually depending on his posture, no radiation, no LE paresthesias   Preventive Screening-Counseling & Management  Alcohol-Tobacco     Smoking Status: never  Caffeine-Diet-Exercise     Does Patient Exercise: yes      Drug Use:  no.    Current Medications (verified): 1)  Atenolol 50 Mg  Tabs (Atenolol) .... Take One Tablet Daily 2)  Ramipril 5 Mg  Caps (Ramipril) .... Take One Tablet Daily 3)  Adult Aspirin Ec Low Strength 81 Mg  Tbec (Aspirin) 4)  Bl Flax Seed Oil 1000 Mg  Caps (Flaxseed (Linseed)) 5)  Zetia 10 Mg Tabs (Ezetimibe) .Marland Kitchen.. 1 By Mouth Qd  Allergies (verified): 1)  ! Lipitor 2)  ! Niacin  Past History:  Past Medical History: Reviewed history from 01/13/2007 and no changes required. Hyperlipidemia Hypertension  Past Surgical History: s/p sinus surgery (remotely) B hernia repair in the 90s   Family History: Reviewed history from 01/31/2008 and no changes required. CAD - no HTN - bro DM - no stroke - no colon ca - no prostate ca - no  Social History: Married 3 kids from [place] Never Smoked Alcohol use-yes (2 glasses of wine/ 3 beers/wk) Regular exercise-yes (2-3x/wk) Drug use-no Occupation: Insurance risk surveyor  Does Patient Exercise:  yes Drug Use:  no Occupation:  employed  Review of Systems General:  Denies fatigue, fever, and weight loss. CV:  Denies chest pain or discomfort and swelling of feet. Resp:  Denies cough,  shortness of breath, and wheezing. GI:  Denies abdominal pain, bloody stools, and diarrhea. GU:  Denies hematuria and urinary frequency; stream is slower . Psych:  Denies anxiety and depression.  Physical Exam  General:  alert, well-developed, and well-nourished.   Neck:  no thyromegaly and normal carotid upstroke.   Lungs:  normal respiratory effort, no intercostal retractions, no accessory muscle use, and normal breath sounds.   Heart:  normal rate, regular rhythm, no murmur, and no gallop.   Abdomen:  soft, non-tender, no distention, no masses, no guarding, and no rigidity.   Rectal:  No external abnormalities noted. Normal sphincter tone. No rectal masses or tenderness. Hemocult neg  Prostate:  Prostate gland firm and smooth, no enlargement, nodularity, tenderness, mass, asymmetry or induration. Extremities:  no pretibial edema bilaterally  Psych:  Cognition and judgment appear intact. Alert and cooperative with normal attention span and concentration.  not anxious appearing and not depressed appearing.     Impression & Recommendations:  Problem # 1:  HEALTH SCREENING (ICD-V70.0) PSA:  0.96 (01/31/2008) Td 2002 virtual colonoscopy 5-07: neg IFOB provided today EKG-- bradycardia otherwise  normal  patient brought labs done @ work 05-09-2009 A1C 5.4 Tc 176, HDL 36, TG 107, LDL 119  Orders: Venipuncture (04540) TLB-ALT (SGPT) (84460-ALT) TLB-AST (SGOT) (84450-SGOT)  TLB-BMP (Basic Metabolic Panel-BMET) (80048-METABOL) TLB-CBC Platelet - w/Differential (85025-CBCD) TLB-TSH (Thyroid Stimulating Hormone) (84443-TSH) TLB-PSA (Prostate Specific Antigen) (84153-PSA)  Problem # 2:  HYPERTENSION (ICD-401.9) Assessment: Unchanged at goal  His updated medication list for this problem includes:    Atenolol 50 Mg Tabs (Atenolol) .Marland Kitchen... Take one tablet daily    Ramipril 5 Mg Caps (Ramipril) .Marland Kitchen... Take one tablet daily  BP today: 132/78 Prior BP: 132/84 (08/07/2008)  Labs  Reviewed: K+: 4.8 (01/31/2008) Creat: : 0.9 (01/31/2008)   Chol: 176 (05/09/2009)   HDL: 36 (05/09/2009)   LDL: 119 (05/09/2009)   TG: 107 (05/09/2009)  Problem # 3:  HYPERLIPIDEMIA (ICD-272.4) patient brought labs done @ work 05-09-2009 A1C 5.4 Tc 176, HDL 36, TG 107, LDL 119 His updated medication list for this problem includes:    Zetia 10 Mg Tabs (Ezetimibe) .Marland Kitchen... 1 by mouth qd  Labs Reviewed: SGOT: 23 (04/18/2007)   SGPT: 22 (01/31/2008)   HDL:36 (05/09/2009), 29.8 (01/31/2008)  LDL:119 (05/09/2009), 120 (01/31/2008)  Chol:176 (05/09/2009), 171 (01/31/2008)  Trig:107 (05/09/2009), 105 (01/31/2008)  Complete Medication List: 1)  Atenolol 50 Mg Tabs (Atenolol) .... Take one tablet daily 2)  Ramipril 5 Mg Caps (Ramipril) .... Take one tablet daily 3)  Adult Aspirin Ec Low Strength 81 Mg Tbec (Aspirin) 4)  Bl Flax Seed Oil 1000 Mg Caps (Flaxseed (linseed)) 5)  Zetia 10 Mg Tabs (Ezetimibe) .Marland Kitchen.. 1 by mouth qd  Other Orders: EKG w/ Interpretation (93000)  Patient Instructions: 1)  Please schedule a follow-up appointment in 6 months .      Preventive Care Screening  Prior Values:    PSA:  0.96 (01/31/2008)    Last Tetanus Booster:  Historical (06/22/2001)

## 2010-10-21 NOTE — Progress Notes (Signed)
  Phone Note Outgoing Call Call back at [facility] Phone [phone]   Call placed by: Ardyth Man,  July 21, 2007 12:52 PM Call placed to: Patient Summary of Call: [facility] and mailed lab letter ...................................................................Ardyth Man  July 21, 2007 12:52 PM

## 2010-10-21 NOTE — Letter (Signed)
Summary: Results Follow up Letter  [facility] [place]  [address] [place], [place] [address]   Phone: [phone]  Fax: [phone]    06/24/2009 MRN: [id number]  Seth Baker [address] [place], [place]  [address]  Dear [name],  The following are the results of your recent test(s):  Test         Result    Pap Smear:        Normal _____  Not Normal _____ Comments: ______________________________________________________ Cholesterol: LDL(Bad cholesterol):         Your goal is less than:         HDL (Good cholesterol):       Your goal is more than: Comments:  ______________________________________________________ Mammogram:        Normal _____  Not Normal _____ Comments:  ___________________________________________________________________ Hemoccult:        Normal __X___  Not normal _______ Comments:    _____________________________________________________________________ Other Tests:    We routinely do not discuss normal results over the telephone.  If you desire a copy of the results, or you have any questions about this information we can discuss them at your next office visit.   Sincerely,

## 2010-10-21 NOTE — Assessment & Plan Note (Signed)
Summary: FLU SHOT//VGJ  Nurse Visit    Prior Medications: ATENOLOL 50 MG  TABS (ATENOLOL) Take one tablet daily RAMIPRIL 5 MG  CAPS (RAMIPRIL) TAKE ONE TABLET DAILY ADULT ASPIRIN EC LOW STRENGTH 81 MG  TBEC (ASPIRIN)  BL FLAX SEED OIL 1000 MG  CAPS (FLAXSEED (LINSEED))  ZETIA 10 MG TABS (EZETIMIBE) 1 by mouth qd Current Allergies: ! LIPITOR ! NIACIN    Orders Added: 1)  Admin 1st Vaccine [90471] 2)  Flu Vaccine 72yrs + [16109]   Flu Vaccine Consent Questions     Do you have a history of severe allergic reactions to this vaccine? no    Any prior history of allergic reactions to egg and/or gelatin? no    Do you have a sensitivity to the preservative Thimersol? no    Do you have a past history of Guillan-Barre Syndrome? no    Do you currently have an acute febrile illness? no    Have you ever had a severe reaction to latex? no    Vaccine information given and explained to patient? yes    Are you currently pregnant? no    Lot Number:[id number]   Site Given  Left Deltoid IM].opcflu

## 2010-10-21 NOTE — Progress Notes (Signed)
Summary: Mailed lab letter   Phone Note Outgoing Call   Call placed by: Ardyth Man,  April 22, 2007 10:45 AM Call placed to: Patient Summary of Call: [facility] ...................................................................Ardyth Man  April 22, 2007 10:45 AM   Follow-up for Phone Call        [facility] ...................................................................Ardyth Man  April 25, 2007 11:02 AM  Follow-up by: Ardyth Man,  April 25, 2007 11:02 AM  Additional Follow-up for Phone Call Additional follow up Details #1::        [facility] ...................................................................Ardyth Man  April 25, 2007 2:36 PM  Additional Follow-up by: Ardyth Man,  April 25, 2007 2:36 PM    Additional Follow-up for Phone Call Additional follow up Details #2::    left message with son to have pt return call ..................................................................Marland KitchenShary Decamp  May 02, 2007 11:33 AM   Additional Follow-up for Phone Call Additional follow up Details #3:: Details for Additional Follow-up Action Taken: Mailed lab letter ...................................................................Ardyth Man  May 09, 2007 8:56 AM [facility] ...................................................................Ardyth Man  May 13, 2007 9:55 AM Mailed copy of letter again ...................................................................Ardyth Man  May 13, 2007 9:55 AM Pt. scheduled for 05/24/07 appt. ...................................................................Ardyth Man  May 13, 2007 9:56 AM  Additional Follow-up by: Ardyth Man,  May 09, 2007 8:56 AM

## 2010-10-21 NOTE — Progress Notes (Signed)
Summary: zetia, rampril, and atenolol  Phone Note Call from Patient Call back at [facility] Phone [phone]   Caller: Patient Summary of Call: PATIENT DROPPED OFF [facility] FOR[facility] FOR ATENOLOL, ZETIA AND RAMIPRIL  LAST VISIT = PHYSICAL = 05/31/09  WILL PLACE IN PLASTIC SLEEVE AND PUT ON ALIDA'S DESK Initial call taken by: Jerolyn Shin,  August 23, 2009 5:24 PM    Prescriptions: RAMIPRIL 5 MG  CAPS (RAMIPRIL) TAKE ONE TABLET DAILY  #90 x 1   Entered by:   Doristine Devoid   Authorized by:   Nolon Rod. Paz MD   Signed by:   Doristine Devoid on 08/27/2009   Method used:   Faxed to ...       [facility] Delivery Fax) (mail-order)             ,          Ph: [phone]       Fax: [phone]   RxID:   [id number] ZETIA 10 MG TABS (EZETIMIBE) 1 by mouth qd  #90 x 1   Entered by:   Doristine Devoid   Authorized by:   Nolon Rod. Paz MD   Signed by:   Doristine Devoid on 08/27/2009   Method used:   Faxed to ...       [facility] Delivery Fax) (mail-order)             ,          Ph: [phone]       Fax: [phone]   RxID:   [id number] ATENOLOL 50 MG  TABS (ATENOLOL) Take one tablet daily  #90 x 1   Entered by:   Doristine Devoid   Authorized by:   Nolon Rod. Paz MD   Signed by:   Doristine Devoid on 08/27/2009   Method used:   Faxed to ...       [facility] Delivery Fax) (mail-order)             ,          Ph: [phone]       Fax: [phone]   RxID:   [id number]

## 2010-10-21 NOTE — Miscellaneous (Signed)
   Clinical Lists Changes  Problems: Changed problem from HYPERLIPIDEMIA (ICD-272.4) to HYPERLIPIDEMIA (ICD-272.4) - Previously declined therapy

## 2010-10-21 NOTE — Progress Notes (Signed)
Summary: PRESCRPTION  Phone Note Refill Request Message from:  Pharmacy [facility] [phone]  Refills Requested: Medication #1:  ZETIA 10 MG TABS 1 by mouth qd.  Medication #2:  ATENOLOL 50 MG  TABS Take one tablet daily  Medication #3:  RAMIPRIL 5 MG  CAPS TAKE ONE TABLET DAILY HAS REQUESTED YOUR ASSISTANCE IN SECURING A NEW PRESCRIPTION TO BE PROCESSED AND DISPENSED BY THE [facility].  Initial call taken by: Barb Merino,  February 28, 2009 10:45 AM      Prescriptions: ZETIA 10 MG TABS (EZETIMIBE) 1 by mouth qd  #90 x 0   Entered by:   Kandice Hams   Authorized by:   Nolon Rod. Paz MD   Signed by:   Kandice Hams on 02/28/2009   Method used:   Faxed to ...       [facility] Delivery Fax) (mail-order)             ,          Ph: [phone]       Fax: [phone]   RxID:   [id number] RAMIPRIL 5 MG  CAPS (RAMIPRIL) TAKE ONE TABLET DAILY  #90 x 0   Entered by:   Kandice Hams   Authorized by:   Nolon Rod. Paz MD   Signed by:   Kandice Hams on 02/28/2009   Method used:   Faxed to ...       [facility] Delivery Fax) (mail-order)             ,          Ph: [phone]       Fax: [phone]   RxID:   [id number] ATENOLOL 50 MG  TABS (ATENOLOL) Take one tablet daily  #90 x 0   Entered by:   Kandice Hams   Authorized by:   Nolon Rod. Paz MD   Signed by:   Kandice Hams on 02/28/2009   Method used:   Faxed to ...       [facility] Delivery Fax) (mail-order)             ,          Ph: [phone]       Fax: [phone]   RxID:   [id number]

## 2010-10-21 NOTE — Assessment & Plan Note (Signed)
Summary: cpx--tl   Vital Signs:  Patient Profile:   55 Years Old Male Height:     68.75 inches Weight:      164.8 pounds Pulse rate:   60 / minute BP sitting:   140 / 80  Vitals Entered By: [name] (Jan 31, 2008 9:40 AM)                 Chief Complaint:  cpx - fasting.  History of Present Illness: CPX L shoulder pain x months works w/ machines a lot    Prior Medication List:  ATENOLOL 50 MG  TABS (ATENOLOL) Take one tablet daily RAMIPRIL 5 MG  CAPS (RAMIPRIL) TAKE ONE TABLET DAILY ADULT ASPIRIN EC LOW STRENGTH 81 MG  TBEC (ASPIRIN)  BL FLAX SEED OIL 1000 MG  CAPS (FLAXSEED (LINSEED))  ZETIA 10 MG TABS (EZETIMIBE) 1 by mouth qd   Current Allergies (reviewed today): ! LIPITOR ! NIACIN  Past Medical History:    Reviewed history from 01/13/2007 and no changes required:       Hyperlipidemia       Hypertension  Past Surgical History:    s/p sinus surgery (remotely)   Family History:    CAD - no    HTN - bro    DM - no    stroke - no    colon ca - no    prostate ca - no  Social History:    Married    3 kids    from [place]    Risk Factors:  Tobacco use:  never Alcohol use:  yes    Comments:  social   Review of Systems  CV      Denies chest pain or discomfort and swelling of feet.  Resp      Denies cough and shortness of breath.  GI      Denies bloody stools and diarrhea.  GU      Denies hematuria and urinary hesitancy.  Neuro      Denies headaches.  Psych      sleeps well   Physical Exam  General:     alert, well-developed, and well-nourished.   Neck:     no thyromegaly and normal carotid upstroke.   Lungs:     normal respiratory effort, no intercostal retractions, no accessory muscle use, and normal breath sounds.   Heart:     normal rate, regular rhythm, and no murmur.   Abdomen:     soft, non-tender, no hepatomegaly, and no splenomegaly.   Rectal:     No external abnormalities noted. Normal sphincter tone. No  rectal masses or tenderness. hemocult neg Prostate:     Prostate gland firm and smooth, no enlargement, nodularity, tenderness, mass, asymmetry or induration. Msk:     R shoulder: nl L shoulder: ROM mildly decreased Extremities:     no edema    Impression & Recommendations:  Problem # 1:  HEALTH SCREENING (ICD-V70.0) virtual colonoscopy 5-07: neg hemocult neg today, will issue hemocults next year PSA 4-08 0.88 diet-exercise: counseled  Orders: Venipuncture (47829) TLB-BMP (Basic Metabolic Panel-BMET) (80048-METABOL) TLB-CBC Platelet - w/Differential (85025-CBCD) TLB-TSH (Thyroid Stimulating Hormone) (84443-TSH) TLB-ALT (SGPT) (84460-ALT) TLB-Lipid Panel (80061-LIPID) TLB-PSA (Prostate Specific Antigen) (84153-PSA)   Problem # 2:  HYPERLIPIDEMIA (ICD-272.4) intolerant to lipitor (increased LFTs-CKs) His updated medication list for this problem includes:    Zetia 10 Mg Tabs (Ezetimibe) .Marland Kitchen... 1 by mouth qd   Problem # 3:  HYPERTENSION (ICD-401.9) Assessment: Unchanged ECHO 9-08: Mild  dyastolid dysfx His updated medication list for this problem includes:    Atenolol 50 Mg Tabs (Atenolol) .Marland Kitchen... Take one tablet daily    Ramipril 5 Mg Caps (Ramipril) .Marland Kitchen... Take one tablet daily  BP today: 140/80 Prior BP: 122/80 (09/27/2007)  Labs Reviewed: Creat: 1.0 (09/27/2007) Chol: 160 (09/27/2007)   HDL: 27.6 (09/27/2007)   LDL: 109 (09/27/2007)   TG: 118 (09/27/2007)   Problem # 4:  SHOULDER PAIN, LEFT (ICD-719.41) OTCs PT v. ortho (shot?)---elected PT His updated medication list for this problem includes:    Adult Aspirin Ec Low Strength 81 Mg Tbec (Aspirin)  Orders: Physical Therapy Referral (PT)   Complete Medication List: 1)  Atenolol 50 Mg Tabs (Atenolol) .... Take one tablet daily 2)  Ramipril 5 Mg Caps (Ramipril) .... Take one tablet daily 3)  Adult Aspirin Ec Low Strength 81 Mg Tbec (Aspirin) 4)  Bl Flax Seed Oil 1000 Mg Caps (Flaxseed (linseed)) 5)  Zetia 10  Mg Tabs (Ezetimibe) .Marland Kitchen.. 1 by mouth qd   Patient Instructions: 1)  Please schedule a follow-up appointment in 6 months.   Prescriptions: ZETIA 10 MG TABS (EZETIMIBE) 1 by mouth qd  #30 x 12   Entered and Authorized by:   Jose E. Paz MD   Signed by:   Nolon Rod. Paz MD on 01/31/2008   Method used:   Print then Give to Patient   RxID:   [id number] RAMIPRIL 5 MG  CAPS (RAMIPRIL) TAKE ONE TABLET DAILY  #30 x 12   Entered and Authorized by:   Nolon Rod. Paz MD   Signed by:   Nolon Rod. Paz MD on 01/31/2008   Method used:   Print then Give to Patient   RxID:   [id number] ATENOLOL 50 MG  TABS (ATENOLOL) Take one tablet daily  #30 x 12   Entered and Authorized by:   Elita Quick E. Paz MD   Signed by:   Nolon Rod. Paz MD on 01/31/2008   Method used:   Print then Give to Patient   RxID:   [id number]  ]  Tetanus/Td Immunization History:    Tetanus/Td # 1:  Historical (06/22/2001)

## 2010-10-21 NOTE — Progress Notes (Signed)
Summary: DR.LOWNE-REFILL  Phone Note Refill Request   Refills Requested: Medication #1:  ATENOLOL 50 MG  TABS Take one tablet daily REC BY FAX,[facility] [address] [phone],FAX [phone]  Initial call taken by: F.DELOACH,SMA      Prescriptions: ATENOLOL 50 MG  TABS (ATENOLOL) Take one tablet daily  #30 x 1   Entered by:   Ardyth Man   Authorized by:   Loreen Freud DO   Signed by:   Ardyth Man on 12/20/2007   Method used:   Electronically sent to ...       [facility]  [address] [id number]       [address]       [place], [place]  [address]       Ph: [phone] or [phone]       Fax: [phone]   RxID:   [id number]

## 2010-12-01 ENCOUNTER — Ambulatory Visit: Payer: Self-pay | Admitting: Internal Medicine

## 2010-12-08 ENCOUNTER — Encounter: Payer: Self-pay | Admitting: Internal Medicine

## 2010-12-08 ENCOUNTER — Ambulatory Visit (INDEPENDENT_AMBULATORY_CARE_PROVIDER_SITE_OTHER): Payer: BC Managed Care – PPO | Admitting: Internal Medicine

## 2010-12-08 DIAGNOSIS — I10 Essential (primary) hypertension: Secondary | ICD-10-CM

## 2010-12-08 DIAGNOSIS — M25519 Pain in unspecified shoulder: Secondary | ICD-10-CM

## 2010-12-09 ENCOUNTER — Encounter: Payer: Self-pay | Admitting: *Deleted

## 2010-12-11 ENCOUNTER — Other Ambulatory Visit: Payer: Self-pay | Admitting: Internal Medicine

## 2010-12-11 ENCOUNTER — Ambulatory Visit (INDEPENDENT_AMBULATORY_CARE_PROVIDER_SITE_OTHER)
Admission: RE | Admit: 2010-12-11 | Discharge: 2010-12-11 | Disposition: A | Discharge status: Home / Self Care | Payer: BC Managed Care – PPO | Source: Ambulatory Visit | Attending: Internal Medicine | Admitting: Internal Medicine

## 2010-12-11 DIAGNOSIS — M25519 Pain in unspecified shoulder: Secondary | ICD-10-CM

## 2010-12-16 ENCOUNTER — Telehealth: Payer: Self-pay | Admitting: *Deleted

## 2010-12-16 DIAGNOSIS — M25519 Pain in unspecified shoulder: Secondary | ICD-10-CM

## 2010-12-16 NOTE — Telephone Encounter (Signed)
Pt aware of results 

## 2010-12-16 NOTE — Telephone Encounter (Signed)
Left message for pt to call back  °

## 2010-12-16 NOTE — Telephone Encounter (Signed)
Message copied by Army Fossa on Tue Dec 16, 2010  1:21 PM ------      Message from: Willow Ora      Created: Mon Dec 15, 2010 10:28 AM       Advise patient:       x-rays negative       he was referred to physical therapy. Please be sure that the physical referral was done.

## 2010-12-18 LAB — FECAL OCCULT BLOOD, IMMUNOCHEMICAL: Fecal Occult Bld: NEGATIVE

## 2010-12-18 NOTE — Assessment & Plan Note (Signed)
Summary: rto 6 months/cbs   Vital Signs:  Patient profile:   55 year old male Weight:      167.50 pounds Pulse rate:   74 / minute Pulse rhythm:   regular BP sitting:   124 / 76  (left arm) Cuff size:   regular  Vitals Entered By: Army Fossa CMA (December 08, 2010 2:53 PM) CC: 6 month f/u- not fasting  Comments (R) shoulder pain x several [facility]    History of Present Illness:  right shoulder pain for 3 months, worse with certain movements. no injury. Has not taken any medications.  Review of systems  no recent ambulatory BPs, and good compliance with medicines  would compliance with cholesterol medication as well  no low extremity edema , no cough  Current Medications (verified): 1)  Atenolol 50 Mg  Tabs (Atenolol) .... Take One Tablet Daily 2)  Ramipril 5 Mg  Caps (Ramipril) .... Take One Tablet Daily 3)  Adult Aspirin Ec Low Strength 81 Mg  Tbec (Aspirin) 4)  Bl Flax Seed Oil 1000 Mg  Caps (Flaxseed (Linseed)) 5)  Zetia 10 Mg Tabs (Ezetimibe) .Marland Kitchen.. 1 By Mouth Qd  Allergies (verified): 1)  ! Lipitor 2)  ! Niacin  Past History:  Past Medical History: Reviewed history from 01/13/2007 and no changes required. Hyperlipidemia Hypertension  Past Surgical History: Reviewed history from 05/31/2009 and no changes required. s/p sinus surgery (remotely) B hernia repair in the 90s   Social History: Reviewed history from 05/31/2009 and no changes required. Married 3 kids from [place] Never Smoked Alcohol use-yes (2 glasses of wine/ 3 beers/wk) Regular exercise-yes (2-3x/wk) Drug use-no Occupation: Insurance risk surveyor   Physical Exam  General:  alert, well-developed, and well-nourished.   Lungs:  normal respiratory effort, no intercostal retractions, no accessory muscle use, and normal breath sounds.   Heart:  normal rate, regular rhythm, no murmur, and no gallop.   Extremities:   left shoulder normal Right shoulder range of motion is slightly  limited. Skin:  several hyperchromic lesions in the back , one of them at  left upper back seems atypical.   Impression & Recommendations:  Problem # 1:  SHOULDER PAIN, RIGHT (ICD-719.41)  meloxicam, GI side effects discussed, x-ray, refer to physical therapy His updated medication list for this problem includes:    Adult Aspirin Ec Low Strength 81 Mg Tbec (Aspirin)    Meloxicam 7.5 Mg Tabs (Meloxicam) .Marland Kitchen... 1 or 2 by mouth once daily  Orders: T-Shoulder Right (73030TC) Misc. Referral (Misc. Ref)  Problem # 2:  SKIN LESION (ICD-709.9)  several moles, patient will see his dermatologist  Problem # 3:  HEALTH SCREENING (ICD-V70.0) iFOB provided again  Problem # 4:  HYPERTENSION (ICD-401.9) at goal, RF His updated medication list for this problem includes:    Atenolol 50 Mg Tabs (Atenolol) .Marland Kitchen... Take one tablet daily    Ramipril 5 Mg Caps (Ramipril) .Marland Kitchen... Take one tablet daily  BP today: 124/76 Prior BP: 128/80 (06/02/2010)  Labs Reviewed: K+: 4.7 (06/02/2010) Creat: : 1.0 (06/02/2010)   Chol: 186 (06/02/2010)   HDL: 31.50 (06/02/2010)   LDL: 128 (06/02/2010)   TG: 133.0 (06/02/2010)  Complete Medication List: 1)  Atenolol 50 Mg Tabs (Atenolol) .... Take one tablet daily 2)  Ramipril 5 Mg Caps (Ramipril) .... Take one tablet daily 3)  Adult Aspirin Ec Low Strength 81 Mg Tbec (Aspirin) 4)  Bl Flax Seed Oil 1000 Mg Caps (Flaxseed (linseed)) 5)  Zetia 10 Mg Tabs (Ezetimibe) .Marland KitchenMarland KitchenMarland Kitchen  1 by mouth qd 6)  Meloxicam 7.5 Mg Tabs (Meloxicam) .Marland Kitchen.. 1 or 2 by mouth once daily  Patient Instructions: 1)  Please schedule a follow-up appointment in 6 months . Fasting , physical exam  2)  watch for stomach irritation w/  meloxicam, take with food  Prescriptions: MELOXICAM 7.5 MG TABS (MELOXICAM) 1 or 2 by mouth once daily  #40 x 0   Entered and Authorized by:   Elita Quick E. Mingo Siegert MD   Signed by:   Nolon Rod. Mande Auvil MD on 12/08/2010   Method used:   Print then Give to Patient   RxID:    [id number] ZETIA 10 MG TABS (EZETIMIBE) 1 by mouth qd  #90 x 3   Entered by:   Army Fossa CMA   Authorized by:   Nolon Rod. Add Dinapoli MD   Signed by:   Army Fossa CMA on 12/08/2010   Method used:   Faxed to ...       [facility] Fax) (mail-order)             ,          Ph: [phone]       Fax: [phone]   RxID:   [id number] RAMIPRIL 5 MG  CAPS (RAMIPRIL) TAKE ONE TABLET DAILY  #90 x 3   Entered by:   Army Fossa CMA   Authorized by:   Nolon Rod. Andra Heslin MD   Signed by:   Army Fossa CMA on 12/08/2010   Method used:   Faxed to ...       [facility] Fax) (mail-order)             ,          Ph: [phone]       Fax: [phone]   RxID:   [id number] ATENOLOL 50 MG  TABS (ATENOLOL) Take one tablet daily  #90 x 3   Entered by:   Army Fossa CMA   Authorized by:   Nolon Rod. Samuele Storey MD   Signed by:   Army Fossa CMA on 12/08/2010   Method used:   Faxed to ...       [facility] Fax) (mail-order)             ,          Ph: [phone]       Fax: [phone]   RxID:   [id number]    Orders Added: 1)  T-Shoulder Right [73030TC] 2)  Misc. Referral [Misc. Ref] 3)  Est. Patient Level III [46270]

## 2010-12-18 NOTE — Letter (Signed)
Summary: [facility]: Immunoassay Fecal Occult Blood (iFOB) Order Form  [facility] [place]  [address] [place], [place] [address]   Phone: [phone]  Fax: [phone]      [facility]: Immunoassay Fecal Occult Blood (iFOB) Order Form   December 09, 2010 MRN: [id number]   Seth Baker 06/04/1956   Physicican Name:jose paz, md_________________________  Diagnosis Code:____v76.51______________________      Army Fossa CMA

## 2010-12-19 ENCOUNTER — Telehealth: Payer: Self-pay | Admitting: *Deleted

## 2010-12-19 NOTE — Telephone Encounter (Signed)
Message copied by Army Fossa on Fri Dec 19, 2010  1:15 PM ------      Message from: Willow Ora      Created: Fri Dec 19, 2010 12:03 PM       Notify patient, test negative

## 2010-12-19 NOTE — Telephone Encounter (Signed)
Message left for patient to return my call.  

## 2010-12-19 NOTE — Telephone Encounter (Signed)
Pt is aware.  

## 2010-12-23 ENCOUNTER — Ambulatory Visit: Payer: BC Managed Care – PPO | Attending: Internal Medicine | Admitting: Rehabilitation

## 2010-12-23 DIAGNOSIS — M25519 Pain in unspecified shoulder: Secondary | ICD-10-CM | POA: Insufficient documentation

## 2010-12-23 DIAGNOSIS — IMO0001 Reserved for inherently not codable concepts without codable children: Secondary | ICD-10-CM | POA: Insufficient documentation

## 2010-12-23 DIAGNOSIS — M25619 Stiffness of unspecified shoulder, not elsewhere classified: Secondary | ICD-10-CM | POA: Insufficient documentation

## 2010-12-29 ENCOUNTER — Ambulatory Visit: Payer: BC Managed Care – PPO | Attending: Internal Medicine | Admitting: Physical Therapy

## 2011-01-01 ENCOUNTER — Ambulatory Visit: Payer: BC Managed Care – PPO | Attending: Internal Medicine | Admitting: Physical Therapy

## 2011-01-01 DIAGNOSIS — M25619 Stiffness of unspecified shoulder, not elsewhere classified: Secondary | ICD-10-CM | POA: Insufficient documentation

## 2011-01-01 DIAGNOSIS — IMO0001 Reserved for inherently not codable concepts without codable children: Secondary | ICD-10-CM | POA: Insufficient documentation

## 2011-01-01 DIAGNOSIS — M25519 Pain in unspecified shoulder: Secondary | ICD-10-CM | POA: Insufficient documentation

## 2011-01-05 ENCOUNTER — Ambulatory Visit: Payer: BC Managed Care – PPO | Attending: Internal Medicine | Admitting: Physical Therapy

## 2011-01-05 DIAGNOSIS — M25519 Pain in unspecified shoulder: Secondary | ICD-10-CM | POA: Insufficient documentation

## 2011-01-05 DIAGNOSIS — IMO0001 Reserved for inherently not codable concepts without codable children: Secondary | ICD-10-CM | POA: Insufficient documentation

## 2011-01-05 DIAGNOSIS — M25619 Stiffness of unspecified shoulder, not elsewhere classified: Secondary | ICD-10-CM | POA: Insufficient documentation

## 2011-01-12 ENCOUNTER — Ambulatory Visit: Payer: BC Managed Care – PPO | Attending: Internal Medicine | Admitting: Physical Therapy

## 2011-01-12 DIAGNOSIS — M25619 Stiffness of unspecified shoulder, not elsewhere classified: Secondary | ICD-10-CM | POA: Insufficient documentation

## 2011-01-12 DIAGNOSIS — M25519 Pain in unspecified shoulder: Secondary | ICD-10-CM | POA: Insufficient documentation

## 2011-01-12 DIAGNOSIS — IMO0001 Reserved for inherently not codable concepts without codable children: Secondary | ICD-10-CM | POA: Insufficient documentation

## 2011-01-20 ENCOUNTER — Ambulatory Visit: Payer: BC Managed Care – PPO | Attending: Internal Medicine | Admitting: Physical Therapy

## 2011-01-20 DIAGNOSIS — IMO0001 Reserved for inherently not codable concepts without codable children: Secondary | ICD-10-CM | POA: Insufficient documentation

## 2011-01-20 DIAGNOSIS — M25519 Pain in unspecified shoulder: Secondary | ICD-10-CM | POA: Insufficient documentation

## 2011-01-20 DIAGNOSIS — M25619 Stiffness of unspecified shoulder, not elsewhere classified: Secondary | ICD-10-CM | POA: Insufficient documentation

## 2011-01-28 ENCOUNTER — Ambulatory Visit: Payer: BC Managed Care – PPO | Admitting: Physical Therapy

## 2011-02-23 ENCOUNTER — Other Ambulatory Visit: Payer: Self-pay | Admitting: Internal Medicine

## 2011-02-23 MED ORDER — RAMIPRIL 5 MG PO CAPS: 5.0000 mg | ORAL_CAPSULE | Freq: Every day | ORAL | Status: DC

## 2011-02-23 MED ORDER — ATENOLOL 50 MG PO TABS: 50.0000 mg | ORAL_TABLET | Freq: Every day | ORAL | Status: DC

## 2011-02-23 MED ORDER — EZETIMIBE 10 MG PO TABS: 10.0000 mg | ORAL_TABLET | Freq: Every day | ORAL | Status: DC

## 2011-02-23 NOTE — Telephone Encounter (Signed)
Sent in

## 2011-06-09 ENCOUNTER — Encounter: Payer: Self-pay | Admitting: Internal Medicine

## 2011-06-09 ENCOUNTER — Ambulatory Visit (INDEPENDENT_AMBULATORY_CARE_PROVIDER_SITE_OTHER): Payer: BC Managed Care – PPO | Admitting: Internal Medicine

## 2011-06-09 VITALS — BP 120/82 | Temp 98.4°F | Ht 69.0 in | Wt 166.8 lb

## 2011-06-09 DIAGNOSIS — Z0000 Encounter for general adult medical examination without abnormal findings: Secondary | ICD-10-CM | POA: Insufficient documentation

## 2011-06-09 HISTORY — DX: Encounter for general adult medical examination without abnormal findings: Z00.00

## 2011-06-09 LAB — CBC WITH DIFFERENTIAL/PLATELET
Basophils Absolute: 0 10*3/uL (ref 0.0–0.1)
Basophils Relative: 0.6 % (ref 0.0–3.0)
Eosinophils Absolute: 0.2 10*3/uL (ref 0.0–0.7)
Eosinophils Relative: 2.7 % (ref 0.0–5.0)
HCT: 43.6 % (ref 39.0–52.0)
Hemoglobin: 14.6 g/dL (ref 13.0–17.0)
Lymphocytes Relative: 16.8 % (ref 12.0–46.0)
Lymphs Abs: 1.1 10*3/uL (ref 0.7–4.0)
MCHC: 33.4 g/dL (ref 30.0–36.0)
MCV: 94.3 fl (ref 78.0–100.0)
Monocytes Absolute: 0.4 10*3/uL (ref 0.1–1.0)
Monocytes Relative: 6.6 % (ref 3.0–12.0)
Neutro Abs: 4.6 10*3/uL (ref 1.4–7.7)
Neutrophils Relative %: 73.3 % (ref 43.0–77.0)
Platelets: 266 10*3/uL (ref 150.0–400.0)
RBC: 4.62 Mil/uL (ref 4.22–5.81)
RDW: 13.4 % (ref 11.5–14.6)
WBC: 6.3 10*3/uL (ref 4.5–10.5)

## 2011-06-09 LAB — COMPREHENSIVE METABOLIC PANEL
ALT: 18 U/L (ref 0–53)
AST: 18 U/L (ref 0–37)
Albumin: 4.3 g/dL (ref 3.5–5.2)
Alkaline Phosphatase: 64 U/L (ref 39–117)
BUN: 12 mg/dL (ref 6–23)
CO2: 30 mEq/L (ref 19–32)
Calcium: 9.7 mg/dL (ref 8.4–10.5)
Chloride: 105 mEq/L (ref 96–112)
Creatinine, Ser: 1 mg/dL (ref 0.4–1.5)
GFR: 85.16 mL/min (ref >60.00)
Glucose, Bld: 105 mg/dL — ABNORMAL HIGH (ref 70–99)
Potassium: 5 mEq/L (ref 3.5–5.1)
Sodium: 141 mEq/L (ref 135–145)
Total Bilirubin: 0.7 mg/dL (ref 0.3–1.2)
Total Protein: 7.2 g/dL (ref 6.0–8.3)

## 2011-06-09 LAB — TSH: TSH: 2.78 u[IU]/mL (ref 0.35–5.50)

## 2011-06-09 LAB — PSA: PSA: 1.32 ng/mL (ref 0.10–4.00)

## 2011-06-09 NOTE — Assessment & Plan Note (Addendum)
Td 2002-- due for one , will bring the pt back at his convenience virtual colonoscopy 5-07: neg, we'll discuss a repeated colonoscopy next year Labs from work reviewed (05-14-11): Hemoglobin A1c 5.5, cholesterol 178, HDL 54, triglycerides 122, LDL 120, glucose 86. We'll check a CMP, TSH, PSA, CBC Diet and exercise discussed Followup in 12 months if he feels well and labs are all normal.

## 2011-06-09 NOTE — Progress Notes (Signed)
  Subjective:    Patient ID: Seth Baker, male    DOB: 1955/09/26, 55 y.o.   MRN: [id number]  HPI Physical exam infex in the toenail?  Past Medical History  Diagnosis Date  . Hyperlipidemia   . Hypertension    Past Surgical History  Procedure Date  . Nasal sinus surgery   . Hernia repair 90s    Bilaterally   Social History: Married 3 kids from [place] Never Smoked Alcohol use-yes (2 glasses of wine/ 3 beers/wk) Regular exercise-yes (2-3x/wk) Drug use-no Occupation: Insurance risk surveyor   Family History: CAD - no HTN - bro DM - no stroke - no colon ca - no prostate ca - no  Review of Systems No chest pain or shortness of breath No cough or wheezing No nausea, vomiting, diarrhea or blood in the stools Occasional  difficulty urinating for years, no gross hematuria.     Objective:   Physical Exam  Constitutional: He is oriented to person, place, and time. He appears well-developed and well-nourished.  HENT:  Head: Normocephalic and atraumatic.  Neck: No thyromegaly present.       Normal carotid pulses  Cardiovascular: Normal rate, regular rhythm and normal heart sounds.   No murmur heard. Pulmonary/Chest: Effort normal and breath sounds normal. No respiratory distress. He has no wheezes. He has no rales.  Abdominal: Soft. Bowel sounds are normal. He exhibits no distension. There is no tenderness. There is no rebound and no guarding.  Genitourinary: Rectum normal and prostate normal.  Musculoskeletal: He exhibits no edema.  Neurological: He is alert and oriented to person, place, and time. No cranial nerve deficit.  Skin: Skin is warm and dry.       All toenails look normal  Psychiatric: He has a normal mood and affect. His behavior is normal. Judgment and thought content normal.          Assessment & Plan:  Office visit performed while all the computers were down, documentation done  after the visit. We'll call him back====> he needs a Td

## 2011-06-10 ENCOUNTER — Telehealth: Payer: Self-pay | Admitting: Internal Medicine

## 2011-06-10 NOTE — Telephone Encounter (Signed)
Advise patient: All labs within normal. Also, he is due for a tetanus shot;  please come back to the office at his convenience for a shot

## 2011-06-11 NOTE — Telephone Encounter (Signed)
Notified pt with results, also transferred to schedulers to get set-up for nurse visit for tetanus shot...06/11/11@8 :50am/LMB

## 2011-06-15 ENCOUNTER — Ambulatory Visit (INDEPENDENT_AMBULATORY_CARE_PROVIDER_SITE_OTHER): Payer: BC Managed Care – PPO

## 2011-06-15 DIAGNOSIS — Z23 Encounter for immunization: Secondary | ICD-10-CM

## 2011-09-03 ENCOUNTER — Other Ambulatory Visit: Payer: Self-pay | Admitting: Internal Medicine

## 2011-09-03 MED ORDER — EZETIMIBE 10 MG PO TABS: 10.0000 mg | ORAL_TABLET | Freq: Every day | ORAL | Status: DC

## 2011-09-03 MED ORDER — RAMIPRIL 5 MG PO CAPS: 5.0000 mg | ORAL_CAPSULE | Freq: Every day | ORAL | Status: DC

## 2011-09-03 MED ORDER — ATENOLOL 50 MG PO TABS: 50.0000 mg | ORAL_TABLET | Freq: Every day | ORAL | Status: DC

## 2011-09-03 NOTE — Telephone Encounter (Signed)
Rx sent 

## 2011-10-01 ENCOUNTER — Ambulatory Visit (INDEPENDENT_AMBULATORY_CARE_PROVIDER_SITE_OTHER): Payer: BC Managed Care – PPO | Admitting: *Deleted

## 2011-10-01 DIAGNOSIS — Z23 Encounter for immunization: Secondary | ICD-10-CM

## 2011-11-15 ENCOUNTER — Other Ambulatory Visit: Payer: Self-pay | Admitting: Internal Medicine

## 2011-11-16 NOTE — Telephone Encounter (Signed)
Refill done.  

## 2011-12-14 ENCOUNTER — Ambulatory Visit (INDEPENDENT_AMBULATORY_CARE_PROVIDER_SITE_OTHER): Payer: BC Managed Care – PPO | Admitting: Internal Medicine

## 2011-12-14 ENCOUNTER — Encounter: Payer: Self-pay | Admitting: Internal Medicine

## 2011-12-14 VITALS — BP 140/84 | HR 59 | Temp 98.1°F | Wt 172.0 lb

## 2011-12-14 DIAGNOSIS — E785 Hyperlipidemia, unspecified: Secondary | ICD-10-CM

## 2011-12-14 DIAGNOSIS — I10 Essential (primary) hypertension: Secondary | ICD-10-CM

## 2011-12-14 NOTE — Progress Notes (Signed)
  Subjective:    Patient ID: Seth Baker, male    DOB: August 17, 1956, 56 y.o.   MRN: [id number]  HPI Routine office visit In general doing very well, good medication compliance. No ambulatory blood pressure readings  Past medical history Hyperlipidemia Hypertension  Past surgical history Nasal sinus surgery Bilateral hernia repair.   Review of Systems His diet continue to be healthy, he continue to be active    Objective:   Physical Exam  General -- alert, well-developed, and well-nourished. NAD  Lungs -- normal respiratory effort, no intercostal retractions, no accessory muscle use, and normal breath sounds.   Heart-- normal rate, regular rhythm, no murmur, and no gallop.   Extremities-- no pretibial edema bilaterally      Assessment & Plan:

## 2011-12-14 NOTE — Assessment & Plan Note (Signed)
Continue with present care, see instructions

## 2011-12-14 NOTE — Patient Instructions (Signed)
Check the  blood pressure 2 or 3 times a months, be sure it is less than 140/85. If it is consistently higher, let me know

## 2011-12-14 NOTE — Assessment & Plan Note (Signed)
Continue present care, zetia samples provided

## 2012-05-09 ENCOUNTER — Other Ambulatory Visit: Payer: Self-pay | Admitting: Internal Medicine

## 2012-05-09 MED ORDER — EZETIMIBE 10 MG PO TABS: 10.0000 mg | ORAL_TABLET | Freq: Every day | ORAL | Status: DC

## 2012-05-09 MED ORDER — ATENOLOL 50 MG PO TABS: 50.0000 mg | ORAL_TABLET | Freq: Every day | ORAL | Status: DC

## 2012-05-09 MED ORDER — RAMIPRIL 5 MG PO CAPS: 5.0000 mg | ORAL_CAPSULE | Freq: Every day | ORAL | Status: DC

## 2012-05-09 NOTE — Telephone Encounter (Signed)
Pt would like meds senet to [facility] [phone] fax sent paperwork to [facility]  RAMIPRIL 5 MG  QTY:90  ATENOLOL 50MG  QTY: 90  ZETIA 10MG  QTY:90

## 2012-05-09 NOTE — Telephone Encounter (Signed)
Refill done.  

## 2012-06-14 ENCOUNTER — Encounter: Payer: BC Managed Care – PPO | Admitting: Internal Medicine

## 2012-06-29 ENCOUNTER — Encounter: Payer: Self-pay | Admitting: Internal Medicine

## 2012-06-29 ENCOUNTER — Ambulatory Visit (INDEPENDENT_AMBULATORY_CARE_PROVIDER_SITE_OTHER): Payer: BC Managed Care – PPO | Admitting: Internal Medicine

## 2012-06-29 VITALS — BP 126/84 | HR 57 | Temp 98.1°F | Ht 68.5 in | Wt 169.0 lb

## 2012-06-29 DIAGNOSIS — Z0000 Encounter for general adult medical examination without abnormal findings: Secondary | ICD-10-CM

## 2012-06-29 DIAGNOSIS — Z23 Encounter for immunization: Secondary | ICD-10-CM

## 2012-06-29 LAB — CBC WITH DIFFERENTIAL/PLATELET
Basophils Absolute: 0 10*3/uL (ref 0.0–0.1)
Basophils Relative: 0.5 % (ref 0.0–3.0)
Eosinophils Absolute: 0.2 10*3/uL (ref 0.0–0.7)
Eosinophils Relative: 2.3 % (ref 0.0–5.0)
HCT: 45.1 % (ref 39.0–52.0)
Hemoglobin: 14.9 g/dL (ref 13.0–17.0)
Lymphocytes Relative: 18 % (ref 12.0–46.0)
Lymphs Abs: 1.2 10*3/uL (ref 0.7–4.0)
MCHC: 33.1 g/dL (ref 30.0–36.0)
MCV: 94 fl (ref 78.0–100.0)
Monocytes Absolute: 0.4 10*3/uL (ref 0.1–1.0)
Monocytes Relative: 5.8 % (ref 3.0–12.0)
Neutro Abs: 5.1 10*3/uL (ref 1.4–7.7)
Neutrophils Relative %: 73.4 % (ref 43.0–77.0)
Platelets: 247 10*3/uL (ref 150.0–400.0)
RBC: 4.8 Mil/uL (ref 4.22–5.81)
RDW: 13.4 % (ref 11.5–14.6)
WBC: 6.9 10*3/uL (ref 4.5–10.5)

## 2012-06-29 LAB — COMPREHENSIVE METABOLIC PANEL
ALT: 23 U/L (ref 0–53)
AST: 28 U/L (ref 0–37)
Albumin: 4.1 g/dL (ref 3.5–5.2)
Alkaline Phosphatase: 60 U/L (ref 39–117)
BUN: 12 mg/dL (ref 6–23)
CO2: 26 mEq/L (ref 19–32)
Calcium: 9.5 mg/dL (ref 8.4–10.5)
Chloride: 106 mEq/L (ref 96–112)
Creatinine, Ser: 0.9 mg/dL (ref 0.4–1.5)
GFR: 87.97 mL/min (ref >60.00)
Glucose, Bld: 84 mg/dL (ref 70–99)
Potassium: 4.3 mEq/L (ref 3.5–5.1)
Sodium: 140 mEq/L (ref 135–145)
Total Bilirubin: 1.8 mg/dL — ABNORMAL HIGH (ref 0.3–1.2)
Total Protein: 7.3 g/dL (ref 6.0–8.3)

## 2012-06-29 LAB — LIPID PANEL
Cholesterol: 177 mg/dL (ref 0–200)
HDL: 34.2 mg/dL — ABNORMAL LOW (ref >39.00)
LDL Cholesterol: 120 mg/dL — ABNORMAL HIGH (ref 0–99)
Total CHOL/HDL Ratio: 5
Triglycerides: 116 mg/dL (ref 0.0–149.0)
VLDL: 23.2 mg/dL (ref 0.0–40.0)

## 2012-06-29 LAB — PSA: PSA: 2.61 ng/mL (ref 0.10–4.00)

## 2012-06-29 NOTE — Progress Notes (Signed)
  Subjective:    Patient ID: Seth Baker, male    DOB: 1956/08/23, 56 y.o.   MRN: [id number]  HPI CPX  Past medical history Hyperlipidemia Hypertension  Past surgical history Nasal sinus surgery Bilateral hernia repair.  Social History: Married, 3 children, last in HS from [place] Never Smoked Alcohol use-yes (2 glasses of wine/ 3 beers/wk) Diet-- regular  Regular exercise-yes, has a stationary bike, uses it from time to time Drug use-no Occupation: Insurance risk surveyor    Family History: CAD - no HTN - bro DM - no stroke - no colon ca - no prostate ca - no  Review of Systems No chest pain or shortness of breath No nausea, vomiting, diarrhea or blood in the stools No dysuria gross hematuria, very rarely has difficulty urinating. No anxiety depression.     Objective:   Physical Exam General -- alert, well-developed, and well-nourished.   Neck --no thyromegaly , normal carotid pulse  Lungs -- normal respiratory effort, no intercostal retractions, no accessory muscle use, and normal breath sounds.   Heart-- normal rate, regular rhythm, no murmur, and no gallop.   Abdomen--soft, non-tender, no distention, no masses, no HSM, no guarding, and no rigidity.   Extremities-- no pretibial edema bilaterally Rectal-- No external abnormalities noted. Normal sphincter tone. No rectal masses or tenderness. no stool found Prostate:  Prostate gland firm and smooth, no enlargement, nodularity, tenderness, mass, asymmetry or induration. Neurologic-- alert & oriented X3 and strength normal in all extremities. Psych-- Cognition and judgment appear intact. Alert and cooperative with normal attention span and concentration.  not anxious appearing and not depressed appearing.      Assessment & Plan:

## 2012-06-29 NOTE — Assessment & Plan Note (Addendum)
Tdap 2012   Flu shot today virtual colonoscopy 5-07: neg, discussed   colonoscopy , pt not sure if he likes to proceed, we agreed to do an iFOB and reassess the situation next year  Labs Follows a regular diet and exercises sometimes, recommend to see if there is any room for improvement of his diet and exercise at least 3 times a week. Followup in 12 months if he feels well and labs are all normal. BP seems to be well-controlled, will check a cholesterol panel.

## 2012-07-04 ENCOUNTER — Encounter: Payer: Self-pay | Admitting: *Deleted

## 2012-07-08 ENCOUNTER — Other Ambulatory Visit: Payer: BC Managed Care – PPO

## 2012-07-08 DIAGNOSIS — Z0000 Encounter for general adult medical examination without abnormal findings: Secondary | ICD-10-CM

## 2012-07-08 LAB — FECAL OCCULT BLOOD, IMMUNOCHEMICAL: Fecal Occult Bld: NEGATIVE

## 2012-07-11 ENCOUNTER — Encounter: Payer: Self-pay | Admitting: *Deleted

## 2012-08-24 ENCOUNTER — Other Ambulatory Visit: Payer: Self-pay | Admitting: Internal Medicine

## 2012-08-24 MED ORDER — EZETIMIBE 10 MG PO TABS: 10.0000 mg | ORAL_TABLET | Freq: Every day | ORAL | Status: DC

## 2012-08-24 MED ORDER — ATENOLOL 50 MG PO TABS: 50.0000 mg | ORAL_TABLET | Freq: Every day | ORAL | Status: DC

## 2012-08-24 MED ORDER — RAMIPRIL 5 MG PO CAPS: 5.0000 mg | ORAL_CAPSULE | Freq: Every day | ORAL | Status: DC

## 2012-08-24 NOTE — Telephone Encounter (Signed)
Pt dropped off forms [facility] prescription for:  ATENOLOL TABS 50 MG  QTY 90 TAKE 1 TABLET DAILY  RAMIPRIL CAPS 5MG   QTY:90 TAKE 1 CAPSULE DAILY  ZETIA TABLETS 10 MG QTY:90 TAKE 1 TABLET DAILY

## 2012-08-24 NOTE — Telephone Encounter (Signed)
Refill done.  

## 2012-11-14 ENCOUNTER — Telehealth: Payer: Self-pay | Admitting: *Deleted

## 2012-11-14 MED ORDER — EZETIMIBE 10 MG PO TABS: 10.0000 mg | ORAL_TABLET | Freq: Every day | ORAL | Status: DC

## 2012-11-14 MED ORDER — ATENOLOL 50 MG PO TABS: 50.0000 mg | ORAL_TABLET | Freq: Every day | ORAL | Status: DC

## 2012-11-14 MED ORDER — RAMIPRIL 5 MG PO CAPS: 5.0000 mg | ORAL_CAPSULE | Freq: Every day | ORAL | Status: DC

## 2012-11-14 NOTE — Telephone Encounter (Signed)
[facility] not receive previous refills.  Re-sent refills.

## 2013-06-29 ENCOUNTER — Telehealth: Payer: Self-pay

## 2013-06-29 NOTE — Telephone Encounter (Signed)
LM for CB  HM reviewed. Due as noted: Flu Vaccine CCS 01/2006 PSA increased but WNL 06/2012

## 2013-06-29 NOTE — Telephone Encounter (Signed)
Medication List and allergies: done  Pharmacy updated, uses [facility] for [facility] day supply Pharmacy undated, uses n/a for local prescriptions  HM UTD: yes Immunizations due: flu vaccine upon arrival  A/P:  LAST: HM due: PSA: Increased but WNL 06/2012  CCS: UTD 2007 DM: na HTN: due Lipids: due  To Discuss with Provider: Nothing at this time

## 2013-07-03 ENCOUNTER — Ambulatory Visit (INDEPENDENT_AMBULATORY_CARE_PROVIDER_SITE_OTHER): Payer: BC Managed Care – PPO | Admitting: Internal Medicine

## 2013-07-03 ENCOUNTER — Encounter: Payer: Self-pay | Admitting: Internal Medicine

## 2013-07-03 VITALS — BP 144/82 | HR 51 | Temp 98.1°F | Ht 68.2 in | Wt 166.8 lb

## 2013-07-03 DIAGNOSIS — I10 Essential (primary) hypertension: Secondary | ICD-10-CM

## 2013-07-03 DIAGNOSIS — Z0000 Encounter for general adult medical examination without abnormal findings: Secondary | ICD-10-CM

## 2013-07-03 DIAGNOSIS — Z23 Encounter for immunization: Secondary | ICD-10-CM

## 2013-07-03 DIAGNOSIS — L989 Disorder of the skin and subcutaneous tissue, unspecified: Secondary | ICD-10-CM

## 2013-07-03 LAB — COMPREHENSIVE METABOLIC PANEL
ALT: 19 U/L (ref 0–53)
AST: 20 U/L (ref 0–37)
Albumin: 4.3 g/dL (ref 3.5–5.2)
Alkaline Phosphatase: 59 U/L (ref 39–117)
BUN: 13 mg/dL (ref 6–23)
CO2: 30 mEq/L (ref 19–32)
Calcium: 9.2 mg/dL (ref 8.4–10.5)
Chloride: 102 mEq/L (ref 96–112)
Creatinine, Ser: 1 mg/dL (ref 0.4–1.5)
GFR: 79.77 mL/min (ref >60.00)
Glucose, Bld: 103 mg/dL — ABNORMAL HIGH (ref 70–99)
Potassium: 4.5 mEq/L (ref 3.5–5.1)
Sodium: 139 mEq/L (ref 135–145)
Total Bilirubin: 1.5 mg/dL — ABNORMAL HIGH (ref 0.3–1.2)
Total Protein: 7.4 g/dL (ref 6.0–8.3)

## 2013-07-03 LAB — CBC WITH DIFFERENTIAL/PLATELET
Basophils Absolute: 0 10*3/uL (ref 0.0–0.1)
Basophils Relative: 0.4 % (ref 0.0–3.0)
Eosinophils Absolute: 0.2 10*3/uL (ref 0.0–0.7)
Eosinophils Relative: 2.5 % (ref 0.0–5.0)
HCT: 43.8 % (ref 39.0–52.0)
Hemoglobin: 15.3 g/dL (ref 13.0–17.0)
Lymphocytes Relative: 18.5 % (ref 12.0–46.0)
Lymphs Abs: 1.4 10*3/uL (ref 0.7–4.0)
MCHC: 34.9 g/dL (ref 30.0–36.0)
MCV: 90.8 fl (ref 78.0–100.0)
Monocytes Absolute: 0.4 10*3/uL (ref 0.1–1.0)
Monocytes Relative: 5.6 % (ref 3.0–12.0)
Neutro Abs: 5.6 10*3/uL (ref 1.4–7.7)
Neutrophils Relative %: 73 % (ref 43.0–77.0)
Platelets: 250 10*3/uL (ref 150.0–400.0)
RBC: 4.82 Mil/uL (ref 4.22–5.81)
RDW: 13 % (ref 11.5–14.6)
WBC: 7.7 10*3/uL (ref 4.5–10.5)

## 2013-07-03 LAB — LIPID PANEL
Cholesterol: 178 mg/dL (ref 0–200)
HDL: 34.2 mg/dL — ABNORMAL LOW (ref >39.00)
LDL Cholesterol: 113 mg/dL — ABNORMAL HIGH (ref 0–99)
Total CHOL/HDL Ratio: 5
Triglycerides: 156 mg/dL — ABNORMAL HIGH (ref 0.0–149.0)
VLDL: 31.2 mg/dL (ref 0.0–40.0)

## 2013-07-03 LAB — TSH: TSH: 3.03 u[IU]/mL (ref 0.35–5.50)

## 2013-07-03 LAB — PSA: PSA: 1.63 ng/mL (ref 0.10–4.00)

## 2013-07-03 NOTE — Assessment & Plan Note (Signed)
rec amb BPs check

## 2013-07-03 NOTE — Assessment & Plan Note (Addendum)
Tdap 2012   Flu shot today virtual colonoscopy 5-07: neg, discussed colonoscopy, thinking about proceed next year, will call when ready, gave an iFOB today Labs Discussed diet and exercises  Followup in 12 months if he feels well and labs are all normal.

## 2013-07-03 NOTE — Patient Instructions (Signed)
Get your blood work before you leave  Next visit in  1 year  for a physical exam ( as long as your BP is ok )   Check the  blood pressure 2 or 3 times a month  be sure it is between 110/60 and 140/85. Ideal blood pressure is 120/80. If it is consistently higher or lower, let me know

## 2013-07-03 NOTE — Assessment & Plan Note (Signed)
L temple lesion likely SK but looks irritated, rec derm referral, has seen Dr Margo Aye before

## 2013-07-03 NOTE — Progress Notes (Signed)
  Subjective:    Patient ID: Seth Baker, male    DOB: December 26, 1955, 57 y.o.   MRN: [id number]  HPI CPX C/o a skin lesion L temple, + itching, see clear d/c sometimes, very seldom sees blood.  Past Medical History  Diagnosis Date  . Hyperlipidemia   . Hypertension    Past Surgical History  Procedure Laterality Date  . Nasal sinus surgery    . Hernia repair  90s    Bilaterally   History   Social History  . Marital Status: Married    Spouse Name: N/A    Number of Children: 3  . Years of Education: N/A   Occupational History  . tool maker    Social History Main Topics  . Smoking status: Never Smoker   . Smokeless tobacco: Never Used  . Alcohol Use: Yes     Comment: socially   . Drug Use: No  . Sexual Activity: Not on file   Other Topics Concern  . Not on file   Social History Narrative   Married, 3 children, from [place]   Family History  Problem Relation Age of Onset  . CAD Neg Hx   . Diabetes Neg Hx   . Colon cancer Neg Hx   . Prostate cancer Neg Hx     Review of Systems  Diet-- eats out for lunch daily  Exercise-- treadmill 2/ week No  CP, SOB, lower extremity edema Denies  nausea, vomiting diarrhea Denies  blood in the stools (-) cough, sputum production, (-) wheezing  No dysuria, gross hematuria, difficulty urinating         Objective:   Physical Exam BP 144/82  Pulse 51  Temp(Src) 98.1 F (36.7 C)  Ht 5' 8.2" (1.732 m)  Wt 166 lb 12.8 oz (75.66 kg)  BMI 25.22 kg/m2  SpO2 99% General -- alert, well-developed, NAD.  Neck --no thyromegaly , normal carotid pulse Lungs -- normal respiratory effort, no intercostal retractions, no accessory muscle use, and normal breath sounds.  Heart-- normal rate, regular rhythm, no murmur.  Abdomen-- Not distended, good bowel sounds,soft, non-tender. Rectal-- No external abnormalities noted. Normal sphincter tone. No rectal masses or tenderness. Brown stool, Hemoccult negative  Prostate--Prostate gland  firm and smooth, no enlargement, nodularity, tenderness, mass, asymmetry or induration. Extremities-- no pretibial edema bilaterally  Skin-- L temple 1 cm oval elevated slt irritated skin lesion, SK? Neurologic--  alert & oriented X3. Speech normal, gait normal, strength normal in all extremities.    Psych-- Cognition and judgment appear intact. Cooperative with normal attention span and concentration. No anxious appearing , no depressed appearing.      Assessment & Plan:

## 2013-07-05 ENCOUNTER — Encounter: Payer: Self-pay | Admitting: *Deleted

## 2013-07-25 ENCOUNTER — Other Ambulatory Visit (INDEPENDENT_AMBULATORY_CARE_PROVIDER_SITE_OTHER): Payer: BC Managed Care – PPO

## 2013-07-25 DIAGNOSIS — Z0000 Encounter for general adult medical examination without abnormal findings: Secondary | ICD-10-CM

## 2013-07-25 LAB — FECAL OCCULT BLOOD, IMMUNOCHEMICAL: Fecal Occult Bld: NEGATIVE

## 2013-08-09 ENCOUNTER — Other Ambulatory Visit: Payer: Self-pay | Admitting: Internal Medicine

## 2013-08-10 NOTE — Telephone Encounter (Signed)
Atenolol, ramipril and zetia all refilled per protocol

## 2013-08-25 ENCOUNTER — Telehealth: Payer: Self-pay | Admitting: Internal Medicine

## 2013-08-25 MED ORDER — ATENOLOL 50 MG PO TABS: ORAL_TABLET | ORAL | Status: DC

## 2013-08-25 MED ORDER — RAMIPRIL 5 MG PO CAPS: ORAL_CAPSULE | ORAL | Status: DC

## 2013-08-25 NOTE — Telephone Encounter (Signed)
Agree, send a 30 day supply to local pharmacy

## 2013-08-25 NOTE — Telephone Encounter (Signed)
Done. DJR  

## 2013-08-25 NOTE — Addendum Note (Signed)
Addended by: Eustace Quail on: 08/25/2013 01:41 PM   Modules accepted: Orders

## 2013-08-25 NOTE — Telephone Encounter (Signed)
Patient called and stated that his mail order for his atenolol (TENORMIN) 50 MG tablet and ramipril (ALTACE) 5 MG capsule is late and wanted to see if dr Drue Novel could refill his medications for 30 days.  Pharmacy [facility] [address]

## 2013-08-25 NOTE — Telephone Encounter (Signed)
[facility]'s prescriptions states that shipment should arrive today  according to there shipment tracking. Notified pt, pt still requesting 30 days at local pharmacy just incase. Please advise. DJR

## 2013-09-21 HISTORY — PX: OTHER SURGICAL HISTORY: SHX169

## 2014-02-13 ENCOUNTER — Other Ambulatory Visit: Payer: Self-pay | Admitting: Internal Medicine

## 2014-02-13 DIAGNOSIS — E785 Hyperlipidemia, unspecified: Secondary | ICD-10-CM

## 2014-02-13 DIAGNOSIS — I10 Essential (primary) hypertension: Secondary | ICD-10-CM

## 2014-02-13 NOTE — Telephone Encounter (Signed)
Refills for Zetia, Atenolol and Altace sent to [facility]

## 2014-02-14 ENCOUNTER — Telehealth: Payer: Self-pay | Admitting: Internal Medicine

## 2014-02-14 NOTE — Telephone Encounter (Signed)
Relevant patient education mailed to patient.  

## 2014-05-08 ENCOUNTER — Other Ambulatory Visit: Payer: Self-pay | Admitting: Internal Medicine

## 2014-07-05 ENCOUNTER — Ambulatory Visit (INDEPENDENT_AMBULATORY_CARE_PROVIDER_SITE_OTHER): Payer: BC Managed Care – PPO | Admitting: Internal Medicine

## 2014-07-05 ENCOUNTER — Ambulatory Visit (HOSPITAL_COMMUNITY)
Admission: RE | Admit: 2014-07-05 | Discharge: 2014-07-05 | Disposition: A | Discharge status: Home / Self Care | Payer: BC Managed Care – PPO | Source: Ambulatory Visit | Attending: Internal Medicine | Admitting: Internal Medicine

## 2014-07-05 ENCOUNTER — Other Ambulatory Visit (HOSPITAL_COMMUNITY): Payer: Self-pay | Admitting: Internal Medicine

## 2014-07-05 ENCOUNTER — Encounter: Payer: Self-pay | Admitting: Internal Medicine

## 2014-07-05 VITALS — BP 136/83 | HR 58 | Temp 97.6°F | Ht 68.0 in | Wt 167.4 lb

## 2014-07-05 DIAGNOSIS — Z23 Encounter for immunization: Secondary | ICD-10-CM

## 2014-07-05 DIAGNOSIS — R0989 Other specified symptoms and signs involving the circulatory and respiratory systems: Secondary | ICD-10-CM | POA: Diagnosis not present

## 2014-07-05 DIAGNOSIS — I10 Essential (primary) hypertension: Secondary | ICD-10-CM

## 2014-07-05 DIAGNOSIS — Z0000 Encounter for general adult medical examination without abnormal findings: Secondary | ICD-10-CM

## 2014-07-05 LAB — HEMOGLOBIN A1C: Hgb A1c MFr Bld: 5.3 % (ref 4.6–6.5)

## 2014-07-05 LAB — LIPID PANEL
Cholesterol: 172 mg/dL (ref 0–200)
HDL: 28.3 mg/dL — ABNORMAL LOW (ref >39.00)
LDL Cholesterol: 119 mg/dL — ABNORMAL HIGH (ref 0–99)
NonHDL: 143.7
Total CHOL/HDL Ratio: 6
Triglycerides: 126 mg/dL (ref 0.0–149.0)
VLDL: 25.2 mg/dL (ref 0.0–40.0)

## 2014-07-05 LAB — BASIC METABOLIC PANEL
BUN: 12 mg/dL (ref 6–23)
CO2: 25 mEq/L (ref 19–32)
Calcium: 9.3 mg/dL (ref 8.4–10.5)
Chloride: 105 mEq/L (ref 96–112)
Creatinine, Ser: 1.1 mg/dL (ref 0.4–1.5)
GFR: 74.42 mL/min (ref >60.00)
Glucose, Bld: 93 mg/dL (ref 70–99)
Potassium: 4.1 mEq/L (ref 3.5–5.1)
Sodium: 139 mEq/L (ref 135–145)

## 2014-07-05 LAB — TSH: TSH: 2.53 u[IU]/mL (ref 0.35–4.50)

## 2014-07-05 NOTE — Assessment & Plan Note (Signed)
Controlled, no change

## 2014-07-05 NOTE — Assessment & Plan Note (Addendum)
Tdap 2012   Flu shot today virtual colonoscopy 5-07: neg, discussed colonoscopy vs colguard, pt concerned about cscopes complications, discussed risk/benefits and elected a cscope------referral PSA consistently normal, DRE 2014 normal-- next prost ca screen--2016 doing great w/ diet and exercises  Followup in 12 months if he feels well and labs are all normal. Absent carotid pulse? Check a [place] [facility]  ekg -- sinus brady

## 2014-07-05 NOTE — Progress Notes (Signed)
   Subjective:    Patient ID: Seth Baker, male    DOB: 01/25/1956, 58 y.o.   MRN: [id number]  DOS:  07/05/2014 Type of visit - description : cpx Interval history: states feels great   ROS Diet-- healthy most of the time  Exercise-- very active, exercises x 2-3/week  No  CP, SOB Denies  nausea, vomiting diarrhea, blood in the stools  (-) cough, sputum production (-) wheezing, chest congestion No dysuria, gross hematuria, difficulty urinating   No anxiety, depression    Past Medical History  Diagnosis Date  . Hyperlipidemia   . Hypertension     Past Surgical History  Procedure Laterality Date  . Nasal sinus surgery    . Hernia repair  90s    Bilaterally    History   Social History  . Marital Status: Married    Spouse Name: N/A    Number of Children: 3  . Years of Education: N/A   Occupational History  . tool maker    Social History Main Topics  . Smoking status: Never Smoker   . Smokeless tobacco: Never Used  . Alcohol Use: Yes     Comment: socially   . Drug Use: No  . Sexual Activity: Not on file   Other Topics Concern  . Not on file   Social History Narrative   Married, 3 children, from [place]     Family History  Problem Relation Age of Onset  . CAD Neg Hx   . Diabetes Neg Hx   . Colon cancer Neg Hx   . Prostate cancer Neg Hx        Medication List       This list is accurate as of: 07/05/14  2:17 PM.  Always use your most recent med list.               aspirin 81 MG tablet  Take 81 mg by mouth daily.     atenolol 50 MG tablet  Commonly known as:  TENORMIN  TAKE 1 TABLET DAILY     FLAXSEED OIL PO  Take 1 tablet by mouth 3 (three) times daily.     ramipril 5 MG capsule  Commonly known as:  ALTACE  TAKE 1 CAPSULE DAILY     ZETIA 10 MG tablet  Generic drug:  ezetimibe  TAKE 1 TABLET DAILY           Objective:   Physical Exam BP 136/83  Pulse 58  Temp(Src) 97.6 F (36.4 C) (Oral)  Ht 5\' 8"  (1.727 m)  Wt  167 lb 6 oz (75.921 kg)  BMI 25.46 kg/m2  SpO2 98%  General -- alert, well-developed, NAD.  Neck --no thyromegaly , normal carotid pulse R, unable to find the L  HEENT-- Not pale.   Lungs -- normal respiratory effort, no intercostal retractions, no accessory muscle use, and normal breath sounds.  Heart-- normal rate, regular rhythm, no murmur.  Abdomen-- Not distended, good bowel sounds,soft, non-tender. No rebound or rigidity.   Extremities-- no pretibial edema bilaterally  Neurologic--  alert & oriented X3. Speech normal, gait appropriate for age, strength symmetric and appropriate for age.  Psych-- Cognition and judgment appear intact. Cooperative with normal attention span and concentration. No anxious or depressed appearing.         Assessment & Plan:

## 2014-07-05 NOTE — Progress Notes (Signed)
VASCULAR LAB PRELIMINARY  PRELIMINARY  PRELIMINARY  PRELIMINARY  Carotid Dopplers completed.    Preliminary report:  1-39% ICA stenosis, bilaterally, lowest end of scale, with adequate flow noted throughout.  Vertebral artery flow is antegrade.  Rayhan Groleau, RVT 07/05/2014, 3:10 PM

## 2014-07-05 NOTE — Patient Instructions (Signed)
Get your blood work before you leave   Check the  blood pressure monthly  Be sure your blood pressure is between  145/65 and 110/65.  if it is consistently higher or lower, let me know     Please come back to the office in 1 year  for a physical exam. Come back fasting

## 2014-07-05 NOTE — Progress Notes (Signed)
Pre visit review using our clinic review tool, if applicable. No additional management support is needed unless otherwise documented below in the visit note. 

## 2014-07-17 ENCOUNTER — Encounter: Payer: Self-pay | Admitting: Internal Medicine

## 2014-08-21 HISTORY — PX: COLONOSCOPY: SHX174

## 2014-08-24 ENCOUNTER — Ambulatory Visit (AMBULATORY_SURGERY_CENTER): Payer: Self-pay | Admitting: *Deleted

## 2014-08-24 VITALS — Ht 67.75 in | Wt 170.2 lb

## 2014-08-24 DIAGNOSIS — Z1211 Encounter for screening for malignant neoplasm of colon: Secondary | ICD-10-CM

## 2014-08-24 MED ORDER — MOVIPREP 100 G PO SOLR: 1.0000 | Freq: Once | ORAL | Status: DC

## 2014-08-24 NOTE — Progress Notes (Signed)
Denies allergies to eggs or soy products. Denies complications with sedation or anesthesia. Denies O2 use. Denies use of diet or weight loss medications.  Emmi instructions given for colonoscopy.  

## 2014-09-03 ENCOUNTER — Encounter: Payer: Self-pay | Admitting: Internal Medicine

## 2014-09-03 ENCOUNTER — Ambulatory Visit (AMBULATORY_SURGERY_CENTER): Payer: BC Managed Care – PPO | Admitting: Internal Medicine

## 2014-09-03 VITALS — BP 129/76 | HR 76 | Temp 98.3°F | Resp 16 | Ht 67.75 in | Wt 170.0 lb

## 2014-09-03 DIAGNOSIS — D123 Benign neoplasm of transverse colon: Secondary | ICD-10-CM

## 2014-09-03 DIAGNOSIS — K620 Anal polyp: Secondary | ICD-10-CM

## 2014-09-03 DIAGNOSIS — Z1211 Encounter for screening for malignant neoplasm of colon: Secondary | ICD-10-CM

## 2014-09-03 MED ORDER — SODIUM CHLORIDE 0.9 % IV SOLN: 500.0000 mL | INTRAVENOUS | Status: DC

## 2014-09-03 NOTE — Progress Notes (Signed)
Called to room to assist during endoscopic procedure.  Patient ID and intended procedure confirmed with present staff. Received instructions for my participation in the procedure from the performing physician.  

## 2014-09-03 NOTE — Progress Notes (Signed)
A/ox3 pleased with MAC, report to Celia RN 

## 2014-09-03 NOTE — Patient Instructions (Signed)
Discharge instructions given. Handouts on polyps,diverticulosis and hemorrhoids. Resume previous medications. Referral to Dr. Johney Maine for characterization/biopsy of anal canal lesion. YOU HAD AN ENDOSCOPIC PROCEDURE TODAY AT [facility]: Refer to the procedure report that was given to you for any specific questions about what was found during the examination.  If the procedure report does not answer your questions, please call your gastroenterologist to clarify.  If you requested that your care partner not be given the details of your procedure findings, then the procedure report has been included in a sealed envelope for you to review at your convenience later.  YOU SHOULD EXPECT: Some feelings of bloating in the abdomen. Passage of more gas than usual.  Walking can help get rid of the air that was put into your GI tract during the procedure and reduce the bloating. If you had a lower endoscopy (such as a colonoscopy or flexible sigmoidoscopy) you may notice spotting of blood in your stool or on the toilet paper. If you underwent a bowel prep for your procedure, then you may not have a normal bowel movement for a few days.  DIET: Your first meal following the procedure should be a light meal and then it is ok to progress to your normal diet.  A half-sandwich or bowl of soup is an example of a good first meal.  Heavy or fried foods are harder to digest and may make you feel nauseous or bloated.  Likewise meals heavy in dairy and vegetables can cause extra gas to form and this can also increase the bloating.  Drink plenty of fluids but you should avoid alcoholic beverages for 24 hours.  ACTIVITY: Your care partner should take you home directly after the procedure.  You should plan to take it easy, moving slowly for the rest of the day.  You can resume normal activity the day after the procedure however you should NOT DRIVE or use heavy machinery for 24 hours (because of the sedation medicines  used during the test).    SYMPTOMS TO REPORT IMMEDIATELY: A gastroenterologist can be reached at any hour.  During normal business hours, 8:30 AM to 5:00 PM Monday through Friday, call [phone].  After hours and on weekends, please call the GI answering service at [phone] who will take a message and have the physician on call contact you.   Following lower endoscopy (colonoscopy or flexible sigmoidoscopy):  Excessive amounts of blood in the stool  Significant tenderness or worsening of abdominal pains  Swelling of the abdomen that is new, acute  Fever of 100F or higher FOLLOW UP: If any biopsies were taken you will be contacted by phone or by letter within the next 1-3 weeks.  Call your gastroenterologist if you have not heard about the biopsies in 3 weeks.  Our staff will call the home number listed on your records the next business day following your procedure to check on you and address any questions or concerns that you may have at that time regarding the information given to you following your procedure. This is a courtesy call and so if there is no answer at the home number and we have not heard from you through the emergency physician on call, we will assume that you have returned to your regular daily activities without incident.  SIGNATURES/CONFIDENTIALITY: You and/or your care partner have signed paperwork which will be entered into your electronic medical record.  These signatures attest to the fact that that the information  above on your After Visit Summary has been reviewed and is understood.  Full responsibility of the confidentiality of this discharge information lies with you and/or your care-partner. 

## 2014-09-03 NOTE — Op Note (Signed)
[place]  [address] [place], [address]   COLONOSCOPY PROCEDURE REPORT  PATIENT: Baker, Seth  MR#: [id number] BIRTHDATE: [birth year] , 74  yrs. old GENDER: male ENDOSCOPIST: Jerene Bears, MD REFERRED IO:EVOJ Larose Kells, M.D. PROCEDURE DATE:  09/03/2014 PROCEDURE:   Colonoscopy with snare polypectomy First Screening Colonoscopy - Avg.  risk and is 50 yrs.  old or older Yes.  Prior Negative Screening - Now for repeat screening. N/A  History of Adenoma - Now for follow-up colonoscopy & has been > or = to 3 yrs.  N/A  Polyps Removed Today? Yes. ASA CLASS:   Class II INDICATIONS:average risk for colon cancer and first colonoscopy. negative virtual colonoscopy in 2007 MEDICATIONS: Monitored anesthesia care and Propofol 300 mg IV  DESCRIPTION OF PROCEDURE:   After the risks benefits and alternatives of the procedure were thoroughly explained, informed consent was obtained.  The digital rectal exam revealed no rectal mass with soft polypoid lesion left lateral anal canal.   The LB [id number]  endoscope was introduced through the anus and advanced to the cecum, which was identified by both the appendix and ileocecal valve. No adverse events experienced.   The quality of the prep was good, using MoviPrep  The instrument was then slowly withdrawn as the colon was fully examined.   COLON FINDINGS: A sessile polyp measuring 5 mm in size was found in the transverse colon.  A polypectomy was performed with a cold snare.  The resection was complete, the polyp tissue was completely retrieved and sent to histology.   There was mild diverticulosis noted in the descending colon and sigmoid colon.   Soft polypoid tissue in anal canal.  Not biopsied due to proximity to small internal hemorrhoid and dentate line.  Small internal hemorrhoids and polypoid lesion at anal canal (left lateral). The time to cecum=9 minutes 10 seconds.  Withdrawal time=12 minutes 11 seconds. The  scope was withdrawn and the procedure completed. COMPLICATIONS: There were no immediate complications.  ENDOSCOPIC IMPRESSION: 1.   Sessile polyp was found in the transverse colon; polypectomy was performed with a cold snare 2.   There was mild diverticulosis noted in the descending colon and sigmoid colon 3.   Soft polypoid lesion in anal canal.  Not biopsied due to proximity to small internal hemorrhoid and dentate line  RECOMMENDATIONS: 1.  Await pathology results 2.  If the polyp removed today is proven to be an adenomatous (pre-cancerous) polyp, you will need a repeat colonoscopy in 5 years.  Otherwise you should continue to follow colorectal cancer screening guidelines for "routine risk" patients with colonoscopy in 10 years.  You will receive a letter within 1-2 weeks with the results of your biopsy as well as final recommendations.  Please call my office if you have not received a letter after 3 weeks. 3.  Referral to Dr.  Johney Maine for characterization/biopsy of anal canal polypoid lesion  eSigned:  Jerene Bears, MD 09/03/2014 2:43 PM cc: Kathlene November, MD and The Patient

## 2014-09-04 ENCOUNTER — Telehealth: Payer: Self-pay | Admitting: *Deleted

## 2014-09-04 ENCOUNTER — Telehealth: Payer: Self-pay

## 2014-09-04 NOTE — Telephone Encounter (Signed)
Message left

## 2014-09-04 NOTE — Telephone Encounter (Signed)
Pt scheduled to see Dr. Johney Maine for anal polypoid lesion 09/26/14@10 :15am. Pt to arrive there at 9:45am. Left message for pt to call back.

## 2014-09-04 NOTE — Telephone Encounter (Signed)
Spoke with pt and he is aware of appt 

## 2014-09-06 ENCOUNTER — Encounter: Payer: Self-pay | Admitting: Internal Medicine

## 2014-09-26 ENCOUNTER — Encounter (INDEPENDENT_AMBULATORY_CARE_PROVIDER_SITE_OTHER): Payer: Self-pay | Admitting: Surgery

## 2014-09-26 ENCOUNTER — Other Ambulatory Visit (INDEPENDENT_AMBULATORY_CARE_PROVIDER_SITE_OTHER): Payer: Self-pay | Admitting: Surgery

## 2014-09-26 DIAGNOSIS — D126 Benign neoplasm of colon, unspecified: Secondary | ICD-10-CM | POA: Insufficient documentation

## 2014-09-26 HISTORY — DX: Benign neoplasm of colon, unspecified: D12.6

## 2014-09-26 NOTE — H&P (Signed)
Seth Baker 09/26/2014 10:34 AM Location: [facility] Patient #: [id number] DOB: 17-Apr-1956 Married / Language: Cleophus Molt / Race: White Male History of Present Illness Adin Hector MD; 09/26/2014 11:13 AM) Patient words: eval anal lesion.  Patient Care Team: Colon Branch, MD as PCP - General Michael Boston, MD as Consulting Physician (General Surgery) Jerene Bears, MD as Consulting Physician (Gastroenterology)  The patient is a 59 year old male who presents with anal lesions. Patient sent to me by Dr. [name] with [facility] gastroenterology for concern of mass in anal canal. Question of polyp.  Negative virtual colonoscopy 2007. Had screening colonoscopy last month. 5 mm adenomatous polyp removed in transverse colon. Mass noted in anal canal. Suspicious for polyp as well. 2 distal to biopsy. Requested surgical evaluation. He normally has a bowel movement every other day. Has had a few episodes of mild rectal bleeding in his lifetime. Not severe. Denies any anorectal problems. No abdominal surgery. Had inguinal hernias repaired in his 62s. Other Problems Lars Mage Spillers, MA; 09/26/2014 10:34 AM) Back Pain Hemorrhoids High blood pressure Hypercholesterolemia Inguinal Hernia  Past Surgical History Lars Mage [place]ill[place], MA; 09/26/2014 10:34 AM) Colon Polyp Removal - Colonoscopy Open Inguinal Hernia Surgery Bilateral. Vasectomy  Diagnostic Studies History Illene Regulus, MA; 09/26/2014 10:34 AM) Colonoscopy within last year  Allergies Lars Mage Spillers, MA; 09/26/2014 10:38 AM) Atorvastatin Calcium *ANTIHYPERLIPIDEMICS* Niacin (Antihyperlipidemic) *ANTIHYPERLIPIDEMICS*  Medication History (Alisha Spillers, MA; 09/26/2014 10:39 AM) Zetia (10MG  Tablet, Oral) Active. Ramipril (5MG  Capsule, Oral) Active. Atenolol (50MG  Tablet, Oral) Active. Baby Aspirin (81MG  Tablet Chewable, Oral) Active. Flaxseed Oil Max Str (1300MG  Capsule, Oral) Active. Medications  Reconciled  Social History Lars Mage [place], [place]; 09/26/2014 10:34 AM) Alcohol use Occasional alcohol use. Caffeine use Carbonated beverages. No drug use Tobacco use Never smoker.  Family History Lars Mage [place], [place]; 09/26/2014 10:34 AM) Hypertension Brother, Mother.     Review of Systems (Alisha Spillers MA; 09/26/2014 10:34 AM) General Not Present- Appetite Loss, Chills, Fatigue, Fever, Night Sweats, Weight Gain and Weight Loss. Skin Not Present- Change in Wart/Mole, Dryness, Hives, Jaundice, New Lesions, Non-Healing Wounds, Rash and Ulcer. HEENT Present- Wears glasses/contact lenses. Not Present- Earache, Hearing Loss, Hoarseness, Nose Bleed, Oral Ulcers, Ringing in the Ears, Seasonal Allergies, Sinus Pain, Sore Throat, Visual Disturbances and Yellow Eyes. Respiratory Not Present- Bloody sputum, Chronic Cough, Difficulty Breathing, Snoring and Wheezing. Breast Not Present- Breast Mass, Breast Pain, Nipple Discharge and Skin Changes. Cardiovascular Not Present- Chest Pain, Difficulty Breathing Lying Down, Leg Cramps, Palpitations, Rapid Heart Rate, Shortness of Breath and Swelling of Extremities. Gastrointestinal Not Present- Abdominal Pain, Bloating, Bloody Stool, Change in Bowel Habits, Chronic diarrhea, Constipation, Difficulty Swallowing, Excessive gas, Gets full quickly at meals, Hemorrhoids, Indigestion, Nausea, Rectal Pain and Vomiting. Male Genitourinary Not Present- Blood in Urine, Change in Urinary Stream, Frequency, Impotence, Nocturia, Painful Urination, Urgency and Urine Leakage. Musculoskeletal Not Present- Back Pain, Joint Pain, Joint Stiffness, Muscle Pain, Muscle Weakness and Swelling of Extremities. Neurological Not Present- Decreased Memory, Fainting, Headaches, Numbness, Seizures, Tingling, Tremor, Trouble walking and Weakness. Psychiatric Not Present- Anxiety, Bipolar, Change in Sleep Pattern, Depression, Fearful and Frequent crying. Endocrine Not Present- Cold  Intolerance, Excessive Hunger, Hair Changes, Heat Intolerance, Hot flashes and New Diabetes. Hematology Not Present- Easy Bruising, Excessive bleeding, Gland problems, HIV and Persistent Infections.  Vitals (Alisha Spillers MA; 09/26/2014 10:36 AM) 09/26/2014 10:36 AM Weight: 167.4 lb Height: 68in Body Surface Area: 1.91 m Body Mass Index: 25.45 kg/m Pulse: 55 (Regular)  BP: 152/84 (Sitting, Left  Arm, Standard)     Physical Exam Adin Hector MD; 09/26/2014 11:12 AM)  General Mental Status-Alert. General Appearance-Not in acute distress, Not Sickly. Orientation-Oriented X3. Hydration-Well hydrated. Voice-Normal.  Integumentary Global Assessment Upon inspection and palpation of skin surfaces of the - Axillae: non-tender, no inflammation or ulceration, no drainage. and Distribution of scalp and body hair is normal. General Characteristics Temperature - normal warmth is noted.  Head and Neck Head-normocephalic, atraumatic with no lesions or palpable masses. Face Global Assessment - atraumatic, no absence of expression. Neck Global Assessment - no abnormal movements, no bruit auscultated on the right, no bruit auscultated on the left, no decreased range of motion, non-tender. Trachea-midline. Thyroid Gland Characteristics - non-tender.  Eye Eyeball - Left-Extraocular movements intact, No Nystagmus. Eyeball - Right-Extraocular movements intact, No Nystagmus. Cornea - Left-No Hazy. Cornea - Right-No Hazy. Sclera/Conjunctiva - Left-No scleral icterus, No Discharge. Sclera/Conjunctiva - Right-No scleral icterus, No Discharge. Pupil - Left-Direct reaction to light normal. Pupil - Right-Direct reaction to light normal.  ENMT Ears Pinna - Left - no drainage observed, no generalized tenderness observed. Right - no drainage observed, no generalized tenderness observed. Nose and Sinuses External Inspection of the Nose - no destructive lesion  observed. Inspection of the nares - Left - quiet respiration. Right - quiet respiration. Mouth and Throat Lips - Upper Lip - no fissures observed, no pallor noted. Lower Lip - no fissures observed, no pallor noted. Nasopharynx - no discharge present. Oral Cavity/Oropharynx - Tongue - no dryness observed. Oral Mucosa - no cyanosis observed. Hypopharynx - no evidence of airway distress observed. Note: Mildly swollen nose with purplish discoloration. No major rosacea.   Chest and Lung Exam Inspection Movements - Normal and Symmetrical. Accessory muscles - No use of accessory muscles in breathing. Palpation Palpation of the chest reveals - Non-tender. Auscultation Breath sounds - Normal and Clear.  Cardiovascular Auscultation Rhythm - Regular. Murmurs & Other Heart Sounds - Auscultation of the heart reveals - No Murmurs and No Systolic Clicks.  Abdomen Inspection Inspection of the abdomen reveals - No Visible peristalsis and No Abnormal pulsations. Umbilicus - No Bleeding, No Urine drainage. Palpation/Percussion Palpation and Percussion of the abdomen reveal - Soft, Non Tender, No Rebound tenderness, No Rigidity (guarding) and No Cutaneous hyperesthesia. Note: 1 cm umbilical hernia through stalk. Flat. Asymptomatic   Male Genitourinary Sexual Maturity Tanner 5 - Adult hair pattern and Adult penile size and shape.  Rectal Note: Perianal skin and clean. No pilonidal disease. No pruritus. No external lesions. Normal sphincter tone. Tolerates digital exam. No hard or ulcerated mass is felt.  Anoscopy done. Right posterior 1 cm adenomatous region suspicious for polyp versus atypical hemorrhoid 2 cm from anal verge in distal rectum/proximal anal canal. Not consistent with condyloma. Does not prolapse. Requested hemorrhoidal piles within normal limits. No fissure. No abscess.   Peripheral Vascular Upper Extremity Inspection - Left - No Cyanotic nailbeds, Not Ischemic. Right - No  Cyanotic nailbeds, Not Ischemic.  Neurologic Neurologic evaluation reveals -normal attention span and ability to concentrate, able to name objects and repeat phrases. Appropriate fund of knowledge , normal sensation and normal coordination. Mental Status Affect - not angry, not paranoid. Cranial Nerves-Normal Bilaterally. Gait-Normal.  Neuropsychiatric Mental status exam performed with findings of-able to articulate well with normal speech/language, rate, volume and coherence, thought content normal with ability to perform basic computations and apply abstract reasoning and no evidence of hallucinations, delusions, obsessions or homicidal/suicidal ideation.  Musculoskeletal Global Assessment Spine, Ribs and  Pelvis - no instability, subluxation or laxity. Right Upper Extremity - no instability, subluxation or laxity.  Lymphatic Head & Neck  General Head & Neck Lymphatics: Bilateral - Description - No Localized lymphadenopathy. Axillary  General Axillary Region: Bilateral - Description - No Localized lymphadenopathy. Femoral & Inguinal  Generalized Femoral & Inguinal Lymphatics: Left - Description - No Localized lymphadenopathy. Right - Description - No Localized lymphadenopathy.    Assessment & Plan Adin Hector MD; 09/26/2014 11:12 AM)  RECTAL MASS (787.99  R19.8) Impression: Right posterior rectal mass. Most likely adenomatous polyp. Think this requires transanal excision. I do not think it is larger complicated enough to warrant a TEM partial proctectomy at this time. Outpatient procedure. Would plan lithotomy position and given posterior location. He agrees to proceed:  The anatomy & physiology of the digestive tract was discussed. The pathophysiology of the rectal pathology was discussed. Natural history risks without surgery was discussed. I feel the risks of no intervention will lead to serious problems that outweigh the operative risks; therefore, I recommended  surgery. Plans transanal removal of mass with primary closure.  Risks such as bleeding, abscess, reoperation, ostomy, heart attack, death, and other risks were discussed. I noted a good likelihood this will help address the problem. Goals of post-operative recovery were discussed as well. We will work to minimize complications. An educational handout was given as well. Questions were answered. The patient expresses understanding & wishes to proceed with surgery.  Current Plans Schedule for Surgery Pt Education - [facility] Rectal Surgery HCI (Arav Bannister): discussed with patient and provided information. 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 He probably had it most of his life. Would hold off on any surgical repair unless it becomes larger or symptomatic.

## 2014-11-07 ENCOUNTER — Other Ambulatory Visit: Payer: Self-pay | Admitting: Internal Medicine

## 2014-12-07 ENCOUNTER — Other Ambulatory Visit: Payer: Self-pay | Admitting: Surgery

## 2015-07-08 ENCOUNTER — Telehealth: Payer: Self-pay

## 2015-07-08 NOTE — Telephone Encounter (Signed)
Called patient to review chart for upcoming OV. Eft message for call back.

## 2015-07-09 ENCOUNTER — Encounter: Payer: Self-pay | Admitting: Internal Medicine

## 2015-07-09 ENCOUNTER — Ambulatory Visit (INDEPENDENT_AMBULATORY_CARE_PROVIDER_SITE_OTHER): Payer: BLUE CROSS/BLUE SHIELD | Admitting: Internal Medicine

## 2015-07-09 VITALS — BP 112/78 | HR 51 | Temp 98.1°F | Ht 68.0 in | Wt 164.5 lb

## 2015-07-09 DIAGNOSIS — Z125 Encounter for screening for malignant neoplasm of prostate: Secondary | ICD-10-CM | POA: Diagnosis not present

## 2015-07-09 DIAGNOSIS — Z114 Encounter for screening for human immunodeficiency virus [HIV]: Secondary | ICD-10-CM

## 2015-07-09 DIAGNOSIS — Z0000 Encounter for general adult medical examination without abnormal findings: Secondary | ICD-10-CM

## 2015-07-09 DIAGNOSIS — Z1159 Encounter for screening for other viral diseases: Secondary | ICD-10-CM

## 2015-07-09 DIAGNOSIS — Z23 Encounter for immunization: Secondary | ICD-10-CM

## 2015-07-09 DIAGNOSIS — Z09 Encounter for follow-up examination after completed treatment for conditions other than malignant neoplasm: Secondary | ICD-10-CM | POA: Insufficient documentation

## 2015-07-09 LAB — COMPREHENSIVE METABOLIC PANEL
ALT: 18 U/L (ref 0–53)
AST: 22 U/L (ref 0–37)
Albumin: 4 g/dL (ref 3.5–5.2)
Alkaline Phosphatase: 62 U/L (ref 39–117)
BUN: 16 mg/dL (ref 6–23)
CO2: 31 mEq/L (ref 19–32)
Calcium: 9.3 mg/dL (ref 8.4–10.5)
Chloride: 104 mEq/L (ref 96–112)
Creatinine, Ser: 1 mg/dL (ref 0.40–1.50)
GFR: 81.05 mL/min (ref >60.00)
Glucose, Bld: 95 mg/dL (ref 70–99)
Potassium: 4.4 mEq/L (ref 3.5–5.1)
Sodium: 139 mEq/L (ref 135–145)
Total Bilirubin: 1.2 mg/dL (ref 0.2–1.2)
Total Protein: 6.7 g/dL (ref 6.0–8.3)

## 2015-07-09 LAB — CBC WITH DIFFERENTIAL/PLATELET
Basophils Absolute: 0 10*3/uL (ref 0.0–0.1)
Basophils Relative: 0.5 % (ref 0.0–3.0)
Eosinophils Absolute: 0.2 10*3/uL (ref 0.0–0.7)
Eosinophils Relative: 2.4 % (ref 0.0–5.0)
HCT: 44 % (ref 39.0–52.0)
Hemoglobin: 14.7 g/dL (ref 13.0–17.0)
Lymphocytes Relative: 17.8 % (ref 12.0–46.0)
Lymphs Abs: 1.2 10*3/uL (ref 0.7–4.0)
MCHC: 33.4 g/dL (ref 30.0–36.0)
MCV: 93.2 fl (ref 78.0–100.0)
Monocytes Absolute: 0.4 10*3/uL (ref 0.1–1.0)
Monocytes Relative: 6 % (ref 3.0–12.0)
Neutro Abs: 5 10*3/uL (ref 1.4–7.7)
Neutrophils Relative %: 73.3 % (ref 43.0–77.0)
Platelets: 269 10*3/uL (ref 150.0–400.0)
RBC: 4.72 Mil/uL (ref 4.22–5.81)
RDW: 13.3 % (ref 11.5–15.5)
WBC: 6.8 10*3/uL (ref 4.0–10.5)

## 2015-07-09 LAB — LIPID PANEL
Cholesterol: 157 mg/dL (ref 0–200)
HDL: 33.6 mg/dL — ABNORMAL LOW (ref >39.00)
LDL Cholesterol: 102 mg/dL — ABNORMAL HIGH (ref 0–99)
NonHDL: 122.91
Total CHOL/HDL Ratio: 5
Triglycerides: 106 mg/dL (ref 0.0–149.0)
VLDL: 21.2 mg/dL (ref 0.0–40.0)

## 2015-07-09 LAB — PSA: PSA: 1.85 ng/mL (ref 0.10–4.00)

## 2015-07-09 MED ORDER — RAMIPRIL 5 MG PO CAPS: 5.0000 mg | ORAL_CAPSULE | Freq: Every day | ORAL | Status: DC

## 2015-07-09 MED ORDER — ATENOLOL 50 MG PO TABS: 50.0000 mg | ORAL_TABLET | Freq: Every day | ORAL | Status: DC

## 2015-07-09 MED ORDER — EZETIMIBE 10 MG PO TABS: 10.0000 mg | ORAL_TABLET | Freq: Every day | ORAL | Status: DC

## 2015-07-09 NOTE — Progress Notes (Signed)
Pre visit review using our clinic review tool, if applicable. No additional management support is needed unless otherwise documented below in the visit note. 

## 2015-07-09 NOTE — Assessment & Plan Note (Signed)
HTN: Seems well-controlled, recommend to check ambulatory BPs from time to time Hyperlipidemia: on Zetia, check labs RTC one year

## 2015-07-09 NOTE — Patient Instructions (Signed)
Get your blood work before you leave   Next visit  for a  complete physical exam in one year, fasting.      Please schedule an appointment at the front desk

## 2015-07-09 NOTE — Assessment & Plan Note (Addendum)
Tdap 2012   Flu shot today virtual colonoscopy 5-07: neg Colonoscopy 08/2014: next  5 years DRE normal, check a PSA Doing well with diet and exercises  Labs: CMP, FLP, CBC, PSA, hep C, HIV

## 2015-07-09 NOTE — Progress Notes (Signed)
Subjective:    Patient ID: Seth Baker, male    DOB: [birth year], 59 y.o.   MRN: [id number]  DOS:  07/09/2015 Type of visit - description : CPX Interval history: Good compliance of medication, no ambulatory BPs, no major concerns, see review of systems    Review of Systems Constitutional: No fever. No chills. No unexplained wt changes. No unusual sweats  HEENT: No dental problems, no ear discharge, no facial swelling, no voice changes. No eye discharge, no eye  redness , no  intolerance to light   Respiratory: No wheezing , no  difficulty breathing. No cough , no mucus production  Cardiovascular: No CP, no leg swelling , no  Palpitations  GI: no nausea, no vomiting, no diarrhea , no  abdominal pain.  No blood in the stools. No dysphagia, no odynophagia    Endocrine: No polyphagia, no polyuria , no polydipsia  GU: No dysuria, gross hematuria, difficulty urinating. No urinary urgency, no frequency.  Musculoskeletal: No joint swellings or unusual aches or pains  Skin: No change in the color of the skin, palor , no  Rash. Has multiple lesions in the back, need to see derm?  Allergic, immunologic: No environmental allergies , no  food allergies  Neurological: No dizziness no  syncope. No headaches. No diplopia, no slurred, no slurred speech, no motor deficits, no facial  Numbness  Hematological: No enlarged lymph nodes, no easy bruising , no unusual bleedings  Psychiatry: No suicidal ideas, no hallucinations, no beavior problems, no confusion.  No unusual/severe anxiety, no depression   Past Medical History  Diagnosis Date  . Hyperlipidemia   . Hypertension     Past Surgical History  Procedure Laterality Date  . Nasal sinus surgery    . Hernia repair  90s    Bilaterally  . Perianal lesion removal  2015    Dr. Johney Maine    Social History   Social History  . Marital Status: Married    Spouse Name: N/A  . Number of Children: 3  . Years of Education: N/A    Occupational History  . tool maker    Social History Main Topics  . Smoking status: Never Smoker   . Smokeless tobacco: Never Used  . Alcohol Use: Yes     Comment: socially   . Drug Use: No  . Sexual Activity: Not on file   Other Topics Concern  . Not on file   Social History Narrative   Married, 3 children, from [place]     Family History  Problem Relation Age of Onset  . CAD Neg Hx   . Diabetes Neg Hx   . Colon cancer Neg Hx   . Prostate cancer Neg Hx       Medication List       This list is accurate as of: 07/09/15 10:02 AM.  Always use your most recent med list.               aspirin 81 MG tablet  Take 81 mg by mouth daily.     atenolol 50 MG tablet  Commonly known as:  TENORMIN  Take 1 tablet (50 mg total) by mouth daily.     ezetimibe 10 MG tablet  Commonly known as:  ZETIA  Take 1 tablet (10 mg total) by mouth daily.     FLAXSEED OIL PO  Take 1 tablet by mouth 3 (three) times daily.     ramipril 5 MG capsule  Commonly known as:  ALTACE  Take 1 capsule (5 mg total) by mouth daily.           Objective:   Physical Exam BP 112/78 mmHg  Pulse 51  Temp(Src) 98.1 F (36.7 C) (Oral)  Ht 5\' 8"  (1.727 m)  Wt 164 lb 8 oz (74.617 kg)  BMI 25.02 kg/m2  SpO2 98% General:   Well developed, well nourished . NAD.  Neck:   No  thyromegaly , normal carotid pulse (L side hard to find ) HEENT:  Normocephalic . Face symmetric, atraumatic Lungs:  CTA B Normal respiratory effort, no intercostal retractions, no accessory muscle use. Heart: RRR,  no murmur.  No pretibial edema bilaterally  Abdomen:  Not distended, soft, non-tender. No rebound; + small reducible umbilical hernia  Skin: Exposed areas without rash. Not pale. Not jaundice. Has a number of skin lesions in the back  Rectal:  External abnormalities: none. Normal sphincter tone. No rectal masses or tenderness.  No stools found Prostate: Prostate gland firm and smooth, no enlargement,  nodularity, tenderness, mass, asymmetry or induration.  Neurologic:  alert & oriented X3.  Speech normal, gait appropriate for age and unassisted Strength symmetric and appropriate for age.  Psych: Cognition and judgment appear intact.  Cooperative with normal attention span and concentration.  Behavior appropriate. No anxious or depressed appearing.    Assessment & Plan:   Assessment> HTN Hyperlipidemia intolerant to atorvastatin and niacin  06-2014 carotid ultrasound normal bilaterally    Plan HTN: Seems well-controlled, recommend to check ambulatory BPs from time to time Hyperlipidemia: on Zetia, check labs RTC one year

## 2015-07-10 LAB — HEPATITIS C ANTIBODY: HCV Ab: NEGATIVE

## 2015-07-10 LAB — HIV ANTIBODY (ROUTINE TESTING W REFLEX): HIV 1&2 Ab, 4th Generation: NONREACTIVE

## 2016-07-09 ENCOUNTER — Ambulatory Visit (INDEPENDENT_AMBULATORY_CARE_PROVIDER_SITE_OTHER): Payer: BLUE CROSS/BLUE SHIELD | Admitting: Internal Medicine

## 2016-07-09 ENCOUNTER — Encounter: Payer: Self-pay | Admitting: Internal Medicine

## 2016-07-09 VITALS — BP 126/70 | HR 48 | Temp 97.8°F | Resp 14 | Ht 68.0 in | Wt 168.2 lb

## 2016-07-09 DIAGNOSIS — Z0000 Encounter for general adult medical examination without abnormal findings: Secondary | ICD-10-CM | POA: Diagnosis not present

## 2016-07-09 DIAGNOSIS — Z23 Encounter for immunization: Secondary | ICD-10-CM | POA: Diagnosis not present

## 2016-07-09 LAB — BASIC METABOLIC PANEL
BUN: 11 mg/dL (ref 6–23)
CO2: 30 mEq/L (ref 19–32)
Calcium: 9.5 mg/dL (ref 8.4–10.5)
Chloride: 106 mEq/L (ref 96–112)
Creatinine, Ser: 1.01 mg/dL (ref 0.40–1.50)
GFR: 79.86 mL/min (ref >60.00)
Glucose, Bld: 99 mg/dL (ref 70–99)
Potassium: 4.5 mEq/L (ref 3.5–5.1)
Sodium: 141 mEq/L (ref 135–145)

## 2016-07-09 LAB — LIPID PANEL
Cholesterol: 165 mg/dL (ref 0–200)
HDL: 37.6 mg/dL — ABNORMAL LOW (ref >39.00)
LDL Cholesterol: 109 mg/dL — ABNORMAL HIGH (ref 0–99)
NonHDL: 127.39
Total CHOL/HDL Ratio: 4
Triglycerides: 91 mg/dL (ref 0.0–149.0)
VLDL: 18.2 mg/dL (ref 0.0–40.0)

## 2016-07-09 MED ORDER — EZETIMIBE 10 MG PO TABS: 10.0000 mg | ORAL_TABLET | Freq: Every day | ORAL | 3 refills | Status: DC

## 2016-07-09 MED ORDER — RAMIPRIL 5 MG PO CAPS: 5.0000 mg | ORAL_CAPSULE | Freq: Every day | ORAL | 3 refills | Status: DC

## 2016-07-09 MED ORDER — ATENOLOL 50 MG PO TABS: 50.0000 mg | ORAL_TABLET | Freq: Every day | ORAL | 3 refills | Status: DC

## 2016-07-09 NOTE — Assessment & Plan Note (Signed)
HTN: RF tenormin,altace, labs, check amb BP, see AVS High Cholesterol-- cont zetia, labs  RTC 1 year

## 2016-07-09 NOTE — Assessment & Plan Note (Addendum)
Tdap 2012  ; flu shot today; Zostavax discussed, will think about it. CCS: --virtual colonoscopy 5-07: neg --Colonoscopy 08/2014: next  5 years Prostate ca screening: DRE  PSA wnl 2016  Doing well with diet and exercises  Previous labs reviewed with the patient, will get a BMP FLP

## 2016-07-09 NOTE — Progress Notes (Signed)
Subjective:    Patient ID: Seth Baker, male    DOB: 10-Dec-1955, 60 y.o.   MRN: [id number]  DOS:  07/09/2016 Type of visit - description : cpx Interval history: Good med compliance, feels great    Review of Systems  Constitutional: No fever. No chills. No unexplained wt changes. No unusual sweats  HEENT: No dental problems, no ear discharge, no facial swelling, no voice changes. No eye discharge, no eye  redness , no  intolerance to light   Respiratory: No wheezing , no  difficulty breathing. No cough , no mucus production  Cardiovascular: No CP, no leg swelling , no  Palpitations  GI: no nausea, no vomiting, no diarrhea , no  abdominal pain.  No blood in the stools. No dysphagia, no odynophagia    Endocrine: No polyphagia, no polyuria , no polydipsia  GU: No dysuria, gross hematuria, difficulty urinating. No urinary urgency, no frequency.  Musculoskeletal: No joint swellings or unusual aches or pains  Skin: No change in the color of the skin, palor , no  Rash  Allergic, immunologic: No environmental allergies , no  food allergies  Neurological: No dizziness no  syncope. No headaches. No diplopia, no slurred, no slurred speech, no motor deficits, no facial  Numbness  Hematological: No enlarged lymph nodes, no easy bruising , no unusual bleedings  Psychiatry: No suicidal ideas, no hallucinations, no beavior problems, no confusion.  No unusual/severe anxiety, no depression    Past Medical History:  Diagnosis Date  . Hyperlipidemia   . Hypertension     Past Surgical History:  Procedure Laterality Date  . HERNIA REPAIR  90s   Bilaterally  . NASAL SINUS SURGERY    . Perianal lesion removal  2015   Dr. Johney Maine    Social History   Social History  . Marital status: Married    Spouse name: N/A  . Number of children: 3  . Years of education: N/A   Occupational History  . tool maker    Social History Main Topics  . Smoking status: Never Smoker  .  Smokeless tobacco: Never Used  . Alcohol use Yes     Comment: socially   . Drug use: No  . Sexual activity: Not on file   Other Topics Concern  . Not on file   Social History Narrative   Married, 3 children , from [place]   1 child at home     Family History  Problem Relation Age of Onset  . Stroke Mother 49  . CAD Neg Hx   . Diabetes Neg Hx   . Colon cancer Neg Hx   . Prostate cancer Neg Hx        Medication List       Accurate as of 07/09/16  6:04 PM. Always use your most recent med list.          aspirin 81 MG tablet Take 81 mg by mouth daily.   atenolol 50 MG tablet Commonly known as:  TENORMIN Take 1 tablet (50 mg total) by mouth daily.   ezetimibe 10 MG tablet Commonly known as:  ZETIA Take 1 tablet (10 mg total) by mouth daily.   FLAXSEED OIL PO Take 1 tablet by mouth 3 (three) times daily.   ramipril 5 MG capsule Commonly known as:  ALTACE Take 1 capsule (5 mg total) by mouth daily.          Objective:   Physical Exam BP 126/70 (BP Location: Left  Arm, Patient Position: Sitting, Cuff Size: Normal)   Pulse (!) 48   Temp 97.8 F (36.6 C) (Oral)   Resp 14   Ht 5\' 8"  (1.727 m)   Wt 168 lb 4 oz (76.3 kg)   SpO2 97%   BMI 25.58 kg/m  General:   Well developed, well nourished . NAD.  Neck: No  thyromegaly  HEENT:  Normocephalic . Face symmetric, atraumatic Lungs:  CTA B Normal respiratory effort, no intercostal retractions, no accessory muscle use. Heart: RRR,  no murmur.  No pretibial edema bilaterally  Abdomen:  Not distended, soft, non-tender. No rebound or rigidity.   Skin: Exposed areas without rash. Not pale. Not jaundice Neurologic:  alert & oriented X3.  Speech normal, gait appropriate for age and unassisted Strength symmetric and appropriate for age.  Psych: Cognition and judgment appear intact.  Cooperative with normal attention span and concentration.  Behavior appropriate. No anxious or depressed appearing.       Assessment & Plan:   Assessment> HTN Hyperlipidemia intolerant to atorvastatin and niacin  06-2014 carotid ultrasound normal bilaterally   PLAN: HTN: RF tenormin,altace, labs, check amb BP, see AVS High Cholesterol-- cont zetia, labs  RTC 1 year

## 2016-07-09 NOTE — Patient Instructions (Signed)
GO TO THE LAB : Get the blood work     GO TO THE FRONT DESK Schedule your next appointment for a  Physical exam in 1 year    Check the  blood pressure  monthly   Be sure your blood pressure is between 110/65 and  135/85. If it is consistently higher or lower, let me know    

## 2016-07-09 NOTE — Progress Notes (Signed)
Pre visit review using our clinic review tool, if applicable. No additional management support is needed unless otherwise documented below in the visit note. 

## 2016-09-21 HISTORY — PX: ROTATOR CUFF REPAIR: SHX139

## 2017-06-28 ENCOUNTER — Other Ambulatory Visit: Payer: Self-pay | Admitting: Orthopedic Surgery

## 2017-06-28 DIAGNOSIS — M25512 Pain in left shoulder: Secondary | ICD-10-CM

## 2017-07-07 ENCOUNTER — Ambulatory Visit
Admission: RE | Admit: 2017-07-07 | Discharge: 2017-07-07 | Disposition: A | Discharge status: Home / Self Care | Payer: BLUE CROSS/BLUE SHIELD | Source: Ambulatory Visit | Attending: Orthopedic Surgery | Admitting: Orthopedic Surgery

## 2017-07-07 ENCOUNTER — Other Ambulatory Visit: Payer: Self-pay | Admitting: Orthopedic Surgery

## 2017-07-07 DIAGNOSIS — M25512 Pain in left shoulder: Secondary | ICD-10-CM

## 2017-07-07 DIAGNOSIS — M795 Residual foreign body in soft tissue: Secondary | ICD-10-CM

## 2017-07-08 ENCOUNTER — Ambulatory Visit
Admission: RE | Admit: 2017-07-08 | Discharge: 2017-07-08 | Disposition: A | Discharge status: Home / Self Care | Payer: BLUE CROSS/BLUE SHIELD | Source: Ambulatory Visit | Attending: Orthopedic Surgery | Admitting: Orthopedic Surgery

## 2017-07-08 DIAGNOSIS — M795 Residual foreign body in soft tissue: Secondary | ICD-10-CM

## 2017-07-12 ENCOUNTER — Ambulatory Visit (INDEPENDENT_AMBULATORY_CARE_PROVIDER_SITE_OTHER): Payer: BLUE CROSS/BLUE SHIELD | Admitting: Internal Medicine

## 2017-07-12 ENCOUNTER — Encounter: Payer: Self-pay | Admitting: Internal Medicine

## 2017-07-12 VITALS — BP 118/68 | HR 44 | Temp 98.0°F | Resp 14 | Ht 68.0 in | Wt 169.5 lb

## 2017-07-12 DIAGNOSIS — Z13820 Encounter for screening for osteoporosis: Secondary | ICD-10-CM

## 2017-07-12 DIAGNOSIS — Z0000 Encounter for general adult medical examination without abnormal findings: Secondary | ICD-10-CM

## 2017-07-12 DIAGNOSIS — Z23 Encounter for immunization: Secondary | ICD-10-CM | POA: Diagnosis not present

## 2017-07-12 DIAGNOSIS — N5089 Other specified disorders of the male genital organs: Secondary | ICD-10-CM

## 2017-07-12 DIAGNOSIS — Z8781 Personal history of (healed) traumatic fracture: Secondary | ICD-10-CM

## 2017-07-12 LAB — CBC WITH DIFFERENTIAL/PLATELET
Basophils Absolute: 0.1 10*3/uL (ref 0.0–0.1)
Basophils Relative: 0.8 % (ref 0.0–3.0)
Eosinophils Absolute: 0.2 10*3/uL (ref 0.0–0.7)
Eosinophils Relative: 3 % (ref 0.0–5.0)
HCT: 44.3 % (ref 39.0–52.0)
Hemoglobin: 15.2 g/dL (ref 13.0–17.0)
Lymphocytes Relative: 20.7 % (ref 12.0–46.0)
Lymphs Abs: 1.3 10*3/uL (ref 0.7–4.0)
MCHC: 34.3 g/dL (ref 30.0–36.0)
MCV: 93.2 fl (ref 78.0–100.0)
Monocytes Absolute: 0.4 10*3/uL (ref 0.1–1.0)
Monocytes Relative: 6.9 % (ref 3.0–12.0)
Neutro Abs: 4.4 10*3/uL (ref 1.4–7.7)
Neutrophils Relative %: 68.6 % (ref 43.0–77.0)
Platelets: 261 10*3/uL (ref 150.0–400.0)
RBC: 4.76 Mil/uL (ref 4.22–5.81)
RDW: 13.4 % (ref 11.5–15.5)
WBC: 6.5 10*3/uL (ref 4.0–10.5)

## 2017-07-12 LAB — COMPREHENSIVE METABOLIC PANEL
ALT: 20 U/L (ref 0–53)
AST: 21 U/L (ref 0–37)
Albumin: 4.2 g/dL (ref 3.5–5.2)
Alkaline Phosphatase: 54 U/L (ref 39–117)
BUN: 12 mg/dL (ref 6–23)
CO2: 29 mEq/L (ref 19–32)
Calcium: 9.4 mg/dL (ref 8.4–10.5)
Chloride: 105 mEq/L (ref 96–112)
Creatinine, Ser: 0.96 mg/dL (ref 0.40–1.50)
GFR: 84.39 mL/min (ref >60.00)
Glucose, Bld: 100 mg/dL — ABNORMAL HIGH (ref 70–99)
Potassium: 4.5 mEq/L (ref 3.5–5.1)
Sodium: 141 mEq/L (ref 135–145)
Total Bilirubin: 0.9 mg/dL (ref 0.2–1.2)
Total Protein: 7.3 g/dL (ref 6.0–8.3)

## 2017-07-12 LAB — LIPID PANEL
Cholesterol: 158 mg/dL (ref 0–200)
HDL: 36.4 mg/dL — ABNORMAL LOW (ref >39.00)
LDL Cholesterol: 101 mg/dL — ABNORMAL HIGH (ref 0–99)
NonHDL: 121.52
Total CHOL/HDL Ratio: 4
Triglycerides: 104 mg/dL (ref 0.0–149.0)
VLDL: 20.8 mg/dL (ref 0.0–40.0)

## 2017-07-12 MED ORDER — RAMIPRIL 5 MG PO CAPS: 5.0000 mg | ORAL_CAPSULE | Freq: Every day | ORAL | 3 refills | Status: DC

## 2017-07-12 MED ORDER — ATENOLOL 50 MG PO TABS: 50.0000 mg | ORAL_TABLET | Freq: Every day | ORAL | 3 refills | Status: DC

## 2017-07-12 MED ORDER — EZETIMIBE 10 MG PO TABS: 10.0000 mg | ORAL_TABLET | Freq: Every day | ORAL | 3 refills | Status: DC

## 2017-07-12 NOTE — Patient Instructions (Signed)
GO TO THE LAB : Get the blood work     GO TO THE FRONT DESK Schedule your next appointment for a  physical exam in one year  

## 2017-07-12 NOTE — Progress Notes (Signed)
Pre visit review using our clinic review tool, if applicable. No additional management support is needed unless otherwise documented below in the visit note. 

## 2017-07-12 NOTE — Assessment & Plan Note (Signed)
HTN: Seems well-controlled on Altace, Tenormin, check labs Hyperlipidemia: on Zetia, check labs. Commercially screening ([facility]) showed - a left mild carotid disease; today I was unable to find the left carotid pulse, not a new issue, previously a carotid ultrasound was normal. No further eval is needed at this time. -Also had  + is screening for osteoporosis. History of remote leg fracture (accident), no FH osteoporosis. Will get a dexa RTC one year

## 2017-07-12 NOTE — Assessment & Plan Note (Addendum)
-  Tdap 2012; Zostavax vs shingrix pro-cons discussed, will think about, he is leaning towards getting a Zostavax.  Flu shot today -CCS: virtual colonoscopy 5-07: neg Colonoscopy 08/2014: next  5 years -Prostate ca screening: DRE  PSA wnl 2016. Reassess next year -Doing well with lifestyle  -labs: CMP, FLP, CBC.

## 2017-07-12 NOTE — Progress Notes (Signed)
Subjective:    Patient ID: Seth Baker, male    DOB: July 09, 1956, 61 y.o.   MRN: [id number]  DOS:  07/12/2017 Type of visit - description : cpx Interval history: No major concerns, he did have some screening work at his job,see below   Review of Systems  A 14 point review of systems is negative   Past Medical History:  Diagnosis Date  . Hyperlipidemia   . Hypertension     Past Surgical History:  Procedure Laterality Date  . HERNIA REPAIR  90s   Bilaterally  . NASAL SINUS SURGERY    . Perianal lesion removal  2015   Dr. Johney Maine    Social History   Social History  . Marital status: Married    Spouse name: N/A  . Number of children: 3  . Years of education: N/A   Occupational History  . tool maker    Social History Main Topics  . Smoking status: Never Smoker  . Smokeless tobacco: Never Used  . Alcohol use Yes     Comment: socially   . Drug use: No  . Sexual activity: Not on file   Other Topics Concern  . Not on file   Social History Narrative   Married, 3 children , from [place]   1 child at home     Family History  Problem Relation Age of Onset  . Stroke Mother 69       lives in [place]   . CAD Neg Hx   . Diabetes Neg Hx   . Colon cancer Neg Hx   . Prostate cancer Neg Hx      Allergies as of 07/12/2017      Reactions   Atorvastatin    Not sure of reaction   Niacin    Redness & itching      Medication List       Accurate as of 07/12/17  3:05 PM. Always use your most recent med list.          aspirin 81 MG tablet Take 81 mg by mouth daily.   atenolol 50 MG tablet Commonly known as:  TENORMIN Take 1 tablet (50 mg total) by mouth daily.   ezetimibe 10 MG tablet Commonly known as:  ZETIA Take 1 tablet (10 mg total) by mouth daily.   FLAXSEED OIL PO Take 1 tablet by mouth 3 (three) times daily.   ramipril 5 MG capsule Commonly known as:  ALTACE Take 1 capsule (5 mg total) by mouth daily.          Objective:   Physical Exam BP 118/68 (BP Location: Left Arm, Patient Position: Sitting, Cuff Size: Small)   Pulse (!) 44   Temp 98 F (36.7 C) (Oral)   Resp 14   Ht 5\' 8"  (1.727 m)   Wt 169 lb 8 oz (76.9 kg)   SpO2 93%   BMI 25.77 kg/m   General:   Well developed, well nourished . NAD.  Neck: No  thyromegaly . Normal right carotid pulse, unable to find the left carotid pulse. Not a new issue. HEENT:  Normocephalic . Face symmetric, atraumatic Lungs:  CTA B Normal respiratory effort, no intercostal retractions, no accessory muscle use. Heart: RRR,  no murmur.  No pretibial edema bilaterally  Abdomen:  Not distended, soft, non-tender. No rebound or rigidity.   Skin: Exposed areas without rash. Not pale. Not jaundice Neurologic:  alert & oriented X3.  Speech normal, gait appropriate for age and  unassisted Strength symmetric and appropriate for age.  Psych: Cognition and judgment appear intact.  Cooperative with normal attention span and concentration.  Behavior appropriate. No anxious or depressed appearing.    Assessment & Plan:   Assessment> HTN Hyperlipidemia intolerant to atorvastatin and niacin  06-2014 carotid US normal   PLAN: HTN: Seems well-controlled on Altace, Tenormin, check labs Hyperlipidemia: on Zetia, check labs. Commercially screening ([facility]) showed - a left mild carotid disease; today I was unable to find the left carotid pulse, not a new issue, previously a carotid ultrasound was normal. No further eval is needed at this time. -Also had  + is screening for osteoporosis. History of remote leg fracture (accident), no FH osteoporosis. Will get a dexa RTC one year

## 2017-07-22 HISTORY — PX: ROTATOR CUFF REPAIR: SHX139

## 2017-09-03 DIAGNOSIS — M24112 Other articular cartilage disorders, left shoulder: Secondary | ICD-10-CM | POA: Diagnosis not present

## 2017-09-03 DIAGNOSIS — M7542 Impingement syndrome of left shoulder: Secondary | ICD-10-CM | POA: Diagnosis not present

## 2017-09-08 DIAGNOSIS — M25612 Stiffness of left shoulder, not elsewhere classified: Secondary | ICD-10-CM | POA: Diagnosis not present

## 2017-09-08 DIAGNOSIS — M6281 Muscle weakness (generalized): Secondary | ICD-10-CM | POA: Diagnosis not present

## 2017-09-08 DIAGNOSIS — M25512 Pain in left shoulder: Secondary | ICD-10-CM | POA: Diagnosis not present

## 2017-09-15 DIAGNOSIS — M6281 Muscle weakness (generalized): Secondary | ICD-10-CM | POA: Diagnosis not present

## 2017-09-15 DIAGNOSIS — M25512 Pain in left shoulder: Secondary | ICD-10-CM | POA: Diagnosis not present

## 2017-09-15 DIAGNOSIS — M25612 Stiffness of left shoulder, not elsewhere classified: Secondary | ICD-10-CM | POA: Diagnosis not present

## 2017-09-17 DIAGNOSIS — M25612 Stiffness of left shoulder, not elsewhere classified: Secondary | ICD-10-CM | POA: Diagnosis not present

## 2017-09-17 DIAGNOSIS — M25512 Pain in left shoulder: Secondary | ICD-10-CM | POA: Diagnosis not present

## 2017-09-17 DIAGNOSIS — M6281 Muscle weakness (generalized): Secondary | ICD-10-CM | POA: Diagnosis not present

## 2017-09-29 DIAGNOSIS — M25612 Stiffness of left shoulder, not elsewhere classified: Secondary | ICD-10-CM | POA: Diagnosis not present

## 2017-09-29 DIAGNOSIS — M25512 Pain in left shoulder: Secondary | ICD-10-CM | POA: Diagnosis not present

## 2017-09-29 DIAGNOSIS — M6281 Muscle weakness (generalized): Secondary | ICD-10-CM | POA: Diagnosis not present

## 2017-10-01 DIAGNOSIS — M25612 Stiffness of left shoulder, not elsewhere classified: Secondary | ICD-10-CM | POA: Diagnosis not present

## 2017-10-01 DIAGNOSIS — M25512 Pain in left shoulder: Secondary | ICD-10-CM | POA: Diagnosis not present

## 2017-10-01 DIAGNOSIS — M6281 Muscle weakness (generalized): Secondary | ICD-10-CM | POA: Diagnosis not present

## 2017-10-04 DIAGNOSIS — M25512 Pain in left shoulder: Secondary | ICD-10-CM | POA: Diagnosis not present

## 2017-10-06 DIAGNOSIS — M25612 Stiffness of left shoulder, not elsewhere classified: Secondary | ICD-10-CM | POA: Diagnosis not present

## 2017-10-06 DIAGNOSIS — M6281 Muscle weakness (generalized): Secondary | ICD-10-CM | POA: Diagnosis not present

## 2017-10-06 DIAGNOSIS — M25512 Pain in left shoulder: Secondary | ICD-10-CM | POA: Diagnosis not present

## 2017-10-08 DIAGNOSIS — M25612 Stiffness of left shoulder, not elsewhere classified: Secondary | ICD-10-CM | POA: Diagnosis not present

## 2017-10-08 DIAGNOSIS — M25512 Pain in left shoulder: Secondary | ICD-10-CM | POA: Diagnosis not present

## 2017-10-08 DIAGNOSIS — M6281 Muscle weakness (generalized): Secondary | ICD-10-CM | POA: Diagnosis not present

## 2017-10-13 DIAGNOSIS — M25512 Pain in left shoulder: Secondary | ICD-10-CM | POA: Diagnosis not present

## 2017-10-13 DIAGNOSIS — M6281 Muscle weakness (generalized): Secondary | ICD-10-CM | POA: Diagnosis not present

## 2017-10-13 DIAGNOSIS — M25612 Stiffness of left shoulder, not elsewhere classified: Secondary | ICD-10-CM | POA: Diagnosis not present

## 2017-10-20 DIAGNOSIS — M6281 Muscle weakness (generalized): Secondary | ICD-10-CM | POA: Diagnosis not present

## 2017-10-20 DIAGNOSIS — M25612 Stiffness of left shoulder, not elsewhere classified: Secondary | ICD-10-CM | POA: Diagnosis not present

## 2017-10-20 DIAGNOSIS — M25512 Pain in left shoulder: Secondary | ICD-10-CM | POA: Diagnosis not present

## 2017-10-27 DIAGNOSIS — M25612 Stiffness of left shoulder, not elsewhere classified: Secondary | ICD-10-CM | POA: Diagnosis not present

## 2017-10-27 DIAGNOSIS — M6281 Muscle weakness (generalized): Secondary | ICD-10-CM | POA: Diagnosis not present

## 2017-10-27 DIAGNOSIS — M25512 Pain in left shoulder: Secondary | ICD-10-CM | POA: Diagnosis not present

## 2017-11-01 DIAGNOSIS — M25512 Pain in left shoulder: Secondary | ICD-10-CM | POA: Diagnosis not present

## 2017-11-09 ENCOUNTER — Telehealth: Payer: Self-pay | Admitting: Internal Medicine

## 2017-11-09 NOTE — Telephone Encounter (Signed)
Received message from [facility] patient wanted to [facility] to call him back.

## 2017-11-09 NOTE — Telephone Encounter (Signed)
Copied from [place]. Topic: Quick Communication - See Telephone Encounter >> Nov 09, 2017 10:20 AM Arletha Grippe wrote: CRM for notification. See Telephone encounter for:   11/09/17. Pt wants to know what the difference between the bone density scan that he had with [facility] screening.  ( you have the results) he would like to know what the cost is.  Cb# [phone]

## 2017-11-09 NOTE — Telephone Encounter (Signed)
LMOM trying to explain difference between testing. Informed him I didn't know how much testing cost. Recommended he discuss w/ PCP at next physical if bone density would be a test that is recommended for him or not.

## 2017-11-10 NOTE — Telephone Encounter (Signed)
Again, LMOM informing Pt I am unsure how much a bone density would be, per Dr. Larose Kells they would discuss medical necessity for him at his CPE in [place].

## 2017-12-27 DIAGNOSIS — M6281 Muscle weakness (generalized): Secondary | ICD-10-CM | POA: Diagnosis not present

## 2018-07-14 ENCOUNTER — Encounter: Payer: Self-pay | Admitting: Internal Medicine

## 2018-07-14 ENCOUNTER — Ambulatory Visit (INDEPENDENT_AMBULATORY_CARE_PROVIDER_SITE_OTHER): Payer: BLUE CROSS/BLUE SHIELD | Admitting: Internal Medicine

## 2018-07-14 VITALS — BP 128/84 | HR 47 | Temp 97.8°F | Resp 16 | Ht 68.0 in | Wt 167.0 lb

## 2018-07-14 DIAGNOSIS — Z125 Encounter for screening for malignant neoplasm of prostate: Secondary | ICD-10-CM | POA: Diagnosis not present

## 2018-07-14 DIAGNOSIS — I779 Disorder of arteries and arterioles, unspecified: Secondary | ICD-10-CM

## 2018-07-14 DIAGNOSIS — Z0000 Encounter for general adult medical examination without abnormal findings: Secondary | ICD-10-CM

## 2018-07-14 DIAGNOSIS — I739 Peripheral vascular disease, unspecified: Secondary | ICD-10-CM

## 2018-07-14 DIAGNOSIS — Z23 Encounter for immunization: Secondary | ICD-10-CM

## 2018-07-14 DIAGNOSIS — Z13820 Encounter for screening for osteoporosis: Secondary | ICD-10-CM

## 2018-07-14 LAB — COMPREHENSIVE METABOLIC PANEL
ALT: 18 U/L (ref 0–53)
AST: 22 U/L (ref 0–37)
Albumin: 4.3 g/dL (ref 3.5–5.2)
Alkaline Phosphatase: 60 U/L (ref 39–117)
BUN: 17 mg/dL (ref 6–23)
CO2: 29 mEq/L (ref 19–32)
Calcium: 9.6 mg/dL (ref 8.4–10.5)
Chloride: 103 mEq/L (ref 96–112)
Creatinine, Ser: 1.07 mg/dL (ref 0.40–1.50)
GFR: 74.22 mL/min (ref >60.00)
Glucose, Bld: 90 mg/dL (ref 70–99)
Potassium: 4.2 mEq/L (ref 3.5–5.1)
Sodium: 139 mEq/L (ref 135–145)
Total Bilirubin: 1.4 mg/dL — ABNORMAL HIGH (ref 0.2–1.2)
Total Protein: 7 g/dL (ref 6.0–8.3)

## 2018-07-14 LAB — CBC WITH DIFFERENTIAL/PLATELET
Basophils Absolute: 0 10*3/uL (ref 0.0–0.1)
Basophils Relative: 0.7 % (ref 0.0–3.0)
Eosinophils Absolute: 0.1 10*3/uL (ref 0.0–0.7)
Eosinophils Relative: 2 % (ref 0.0–5.0)
HCT: 43 % (ref 39.0–52.0)
Hemoglobin: 14.8 g/dL (ref 13.0–17.0)
Lymphocytes Relative: 18.5 % (ref 12.0–46.0)
Lymphs Abs: 1.2 10*3/uL (ref 0.7–4.0)
MCHC: 34.4 g/dL (ref 30.0–36.0)
MCV: 92.6 fl (ref 78.0–100.0)
Monocytes Absolute: 0.5 10*3/uL (ref 0.1–1.0)
Monocytes Relative: 7.1 % (ref 3.0–12.0)
Neutro Abs: 4.7 10*3/uL (ref 1.4–7.7)
Neutrophils Relative %: 71.7 % (ref 43.0–77.0)
Platelets: 262 10*3/uL (ref 150.0–400.0)
RBC: 4.64 Mil/uL (ref 4.22–5.81)
RDW: 13.2 % (ref 11.5–15.5)
WBC: 6.6 10*3/uL (ref 4.0–10.5)

## 2018-07-14 LAB — LIPID PANEL
Cholesterol: 164 mg/dL (ref 0–200)
HDL: 35.5 mg/dL — ABNORMAL LOW (ref >39.00)
LDL Cholesterol: 107 mg/dL — ABNORMAL HIGH (ref 0–99)
NonHDL: 128.69
Total CHOL/HDL Ratio: 5
Triglycerides: 110 mg/dL (ref 0.0–149.0)
VLDL: 22 mg/dL (ref 0.0–40.0)

## 2018-07-14 LAB — TSH: TSH: 2.85 u[IU]/mL (ref 0.35–4.50)

## 2018-07-14 LAB — PSA: PSA: 2.78 ng/mL (ref 0.10–4.00)

## 2018-07-14 MED ORDER — ZOSTER VACCINE LIVE 19400 UNT/0.65ML ~~LOC~~ SUSR: 0.6500 mL | Freq: Once | SUBCUTANEOUS | 0 refills | Status: AC

## 2018-07-14 MED ORDER — RAMIPRIL 5 MG PO CAPS: 5.0000 mg | ORAL_CAPSULE | Freq: Every day | ORAL | 3 refills | Status: DC

## 2018-07-14 MED ORDER — ATENOLOL 50 MG PO TABS: 50.0000 mg | ORAL_TABLET | Freq: Every day | ORAL | 3 refills | Status: DC

## 2018-07-14 MED ORDER — EZETIMIBE 10 MG PO TABS: 10.0000 mg | ORAL_TABLET | Freq: Every day | ORAL | 3 refills | Status: DC

## 2018-07-14 NOTE — Progress Notes (Signed)
Pre visit review using our clinic review tool, if applicable. No additional management support is needed unless otherwise documented below in the visit note. 

## 2018-07-14 NOTE — Patient Instructions (Signed)
GO TO THE LAB : Get the blood work     GO TO THE FRONT DESK Schedule your next appointment for a  Physical exam in 1 year 

## 2018-07-14 NOTE — Progress Notes (Signed)
Subjective:    Patient ID: Seth Baker, male    DOB: [birth year], 62 y.o.   MRN: [id number]  DOS:  07/14/2018 Type of visit - description : cpx Interval history: Here for a physical Recently life screen 08-2017 show concerns for osteoporosis and mild carotid disease.  He is asymptomatic.   Review of Systems  A 14 point review of systems is negative    Past Medical History:  Diagnosis Date  . Hyperlipidemia   . Hypertension     Past Surgical History:  Procedure Laterality Date  . HERNIA REPAIR  90s   Bilaterally  . NASAL SINUS SURGERY    . Perianal lesion removal  2015   Dr. Johney Maine  . ROTATOR CUFF REPAIR Left 07/2017    Social History   Socioeconomic History  . Marital status: Married    Spouse name: Not on file  . Number of children: 3  . Years of education: Not on file  . Highest education level: Not on file  Occupational History  . Occupation: Surveyor, quantity  Social Needs  . Financial resource strain: Not on file  . Food insecurity:    Worry: Not on file    Inability: Not on file  . Transportation needs:    Medical: Not on file    Non-medical: Not on file  Tobacco Use  . Smoking status: Never Smoker  . Smokeless tobacco: Never Used  Substance and Sexual Activity  . Alcohol use: Yes    Comment: socially   . Drug use: No  . Sexual activity: Not on file  Lifestyle  . Physical activity:    Days per week: Not on file    Minutes per session: Not on file  . Stress: Not on file  Relationships  . Social connections:    Talks on phone: Not on file    Gets together: Not on file    Attends religious service: Not on file    Active member of club or organization: Not on file    Attends meetings of clubs or organizations: Not on file    Relationship status: Not on file  . Intimate partner violence:    Fear of current or ex partner: Not on file    Emotionally abused: Not on file    Physically abused: Not on file    Forced sexual activity: Not on file    Other Topics Concern  . Not on file  Social History Narrative   Married, 3 children , from [place]   1 child at home     Family History  Problem Relation Age of Onset  . Stroke Mother 41       lives in [place]   . CAD Neg Hx   . Diabetes Neg Hx   . Colon cancer Neg Hx   . Prostate cancer Neg Hx      Allergies as of 07/14/2018      Reactions   Atorvastatin    Not sure of reaction   Niacin    Redness & itching      Medication List        Accurate as of 07/14/18 11:59 PM. Always use your most recent med list.          aspirin 81 MG tablet Take 81 mg by mouth daily.   atenolol 50 MG tablet Commonly known as:  TENORMIN Take 1 tablet (50 mg total) by mouth daily.   ezetimibe 10 MG tablet Commonly known as:  ZETIA  Take 1 tablet (10 mg total) by mouth daily.   FLAXSEED OIL PO Take 1 tablet by mouth 3 (three) times daily.   ramipril 5 MG capsule Commonly known as:  ALTACE Take 1 capsule (5 mg total) by mouth daily.   Zoster Vaccine Live (PF) 19400 UNT/0.65ML injection Commonly known as:  ZOSTAVAX Inject 19,400 Units into the skin once for 1 dose.          Objective:   Physical Exam BP 128/84 (BP Location: Left Arm, Patient Position: Sitting, Cuff Size: Small)   Pulse (!) 47   Temp 97.8 F (36.6 C) (Oral)   Resp 16   Ht 5\' 8"  (1.727 m)   Wt 167 lb (75.8 kg)   SpO2 94%   BMI 25.39 kg/m  General: Well developed, NAD, see BMI.  Neck: No  thyromegaly  HEENT:  Normocephalic . Face symmetric, atraumatic Lungs:  CTA B Normal respiratory effort, no intercostal retractions, no accessory muscle use. Heart: RRR,  no murmur.  No pretibial edema bilaterally  Abdomen:  Not distended, soft, non-tender. No rebound or rigidity.   Rectal: External abnormalities: none. Normal sphincter tone. No rectal masses or tenderness.  No  stools Prostate: Prostate gland firm and smooth, no enlargement, nodularity, tenderness, mass, asymmetry or  induration Skin: Exposed areas without rash. Not pale. Not jaundice Neurologic:  alert & oriented X3.  Speech normal, gait appropriate for age and unassisted Strength symmetric and appropriate for age.  Psych: Cognition and judgment appear intact.  Cooperative with normal attention span and concentration.  Behavior appropriate. No anxious or depressed appearing.     Assessment & Plan:    Assessment  HTN Hyperlipidemia intolerant to atorvastatin and niacin  06-2014 carotid US normal  (1-39%)  PLAN: HTN: Continue Tenormin, Altace. Hyperlipidemia: Continue Zetia, checking labs + Screening for osteoporosis and  carotid artery disease (08/2017), pt was unable to obtain a DEXA or ultrasound, will try again. RTC 1 year

## 2018-07-14 NOTE — Assessment & Plan Note (Signed)
-  Tdap 2012;   Flu shot today. Shingles immunization: The patient did his own research and prefers Zostavax, literature reviewed, Shingrix is preferred due to a number of reasons.  Patient still likes Zostavax.  Prescription printed. -CCS: virtual colonoscopy 5-07: neg Colonoscopy 08/2014: next  5 years -Prostate ca screening: DRE wnl, check a PSA  -Doing well with lifestyle  -labs: CMP, FLP, CBC, PSA, ths.

## 2018-07-17 NOTE — Assessment & Plan Note (Signed)
HTN: Continue Tenormin, Altace. Hyperlipidemia: Continue Zetia, checking labs + Screening for osteoporosis and  carotid artery disease (08/2017), pt was unable to obtain a DEXA or ultrasound, will try again. RTC 1 year

## 2018-07-18 ENCOUNTER — Other Ambulatory Visit: Payer: Self-pay | Admitting: Internal Medicine

## 2018-07-18 ENCOUNTER — Ambulatory Visit (HOSPITAL_COMMUNITY)
Admission: RE | Admit: 2018-07-18 | Discharge: 2018-07-18 | Disposition: A | Discharge status: Home / Self Care | Payer: BLUE CROSS/BLUE SHIELD | Source: Ambulatory Visit | Attending: Cardiovascular Disease | Admitting: Cardiovascular Disease

## 2018-07-18 DIAGNOSIS — I6523 Occlusion and stenosis of bilateral carotid arteries: Secondary | ICD-10-CM | POA: Diagnosis not present

## 2019-04-27 ENCOUNTER — Other Ambulatory Visit: Payer: Self-pay | Admitting: Internal Medicine

## 2019-06-14 DIAGNOSIS — L820 Inflamed seborrheic keratosis: Secondary | ICD-10-CM | POA: Diagnosis not present

## 2019-06-14 DIAGNOSIS — L821 Other seborrheic keratosis: Secondary | ICD-10-CM | POA: Diagnosis not present

## 2019-06-14 DIAGNOSIS — L570 Actinic keratosis: Secondary | ICD-10-CM | POA: Diagnosis not present

## 2019-06-14 DIAGNOSIS — X32XXXD Exposure to sunlight, subsequent encounter: Secondary | ICD-10-CM | POA: Diagnosis not present

## 2019-06-14 DIAGNOSIS — L7211 Pilar cyst: Secondary | ICD-10-CM | POA: Diagnosis not present

## 2019-07-17 ENCOUNTER — Other Ambulatory Visit: Payer: Self-pay

## 2019-07-18 ENCOUNTER — Encounter: Payer: Self-pay | Admitting: Internal Medicine

## 2019-07-18 ENCOUNTER — Other Ambulatory Visit: Payer: Self-pay

## 2019-07-18 ENCOUNTER — Ambulatory Visit (INDEPENDENT_AMBULATORY_CARE_PROVIDER_SITE_OTHER): Payer: BC Managed Care – PPO | Admitting: Internal Medicine

## 2019-07-18 VITALS — BP 173/78 | HR 51 | Temp 96.9°F | Resp 16 | Ht 68.0 in | Wt 168.5 lb

## 2019-07-18 DIAGNOSIS — Z23 Encounter for immunization: Secondary | ICD-10-CM

## 2019-07-18 DIAGNOSIS — Z0000 Encounter for general adult medical examination without abnormal findings: Secondary | ICD-10-CM

## 2019-07-18 DIAGNOSIS — I10 Essential (primary) hypertension: Secondary | ICD-10-CM

## 2019-07-18 LAB — COMPREHENSIVE METABOLIC PANEL
ALT: 17 U/L (ref 0–53)
AST: 17 U/L (ref 0–37)
Albumin: 4.3 g/dL (ref 3.5–5.2)
Alkaline Phosphatase: 65 U/L (ref 39–117)
BUN: 14 mg/dL (ref 6–23)
CO2: 31 mEq/L (ref 19–32)
Calcium: 9.6 mg/dL (ref 8.4–10.5)
Chloride: 103 mEq/L (ref 96–112)
Creatinine, Ser: 0.95 mg/dL (ref 0.40–1.50)
GFR: 79.85 mL/min (ref >60.00)
Glucose, Bld: 106 mg/dL — ABNORMAL HIGH (ref 70–99)
Potassium: 4.7 mEq/L (ref 3.5–5.1)
Sodium: 138 mEq/L (ref 135–145)
Total Bilirubin: 1.4 mg/dL — ABNORMAL HIGH (ref 0.2–1.2)
Total Protein: 6.9 g/dL (ref 6.0–8.3)

## 2019-07-18 LAB — CBC WITH DIFFERENTIAL/PLATELET
Basophils Absolute: 0.1 10*3/uL (ref 0.0–0.1)
Basophils Relative: 1.1 % (ref 0.0–3.0)
Eosinophils Absolute: 0.2 10*3/uL (ref 0.0–0.7)
Eosinophils Relative: 3.3 % (ref 0.0–5.0)
HCT: 45.6 % (ref 39.0–52.0)
Hemoglobin: 15.5 g/dL (ref 13.0–17.0)
Lymphocytes Relative: 22.1 % (ref 12.0–46.0)
Lymphs Abs: 1.4 10*3/uL (ref 0.7–4.0)
MCHC: 34 g/dL (ref 30.0–36.0)
MCV: 93 fl (ref 78.0–100.0)
Monocytes Absolute: 0.5 10*3/uL (ref 0.1–1.0)
Monocytes Relative: 7.9 % (ref 3.0–12.0)
Neutro Abs: 4.1 10*3/uL (ref 1.4–7.7)
Neutrophils Relative %: 65.6 % (ref 43.0–77.0)
Platelets: 263 10*3/uL (ref 150.0–400.0)
RBC: 4.91 Mil/uL (ref 4.22–5.81)
RDW: 13.1 % (ref 11.5–15.5)
WBC: 6.2 10*3/uL (ref 4.0–10.5)

## 2019-07-18 LAB — LIPID PANEL
Cholesterol: 181 mg/dL (ref 0–200)
HDL: 34.6 mg/dL — ABNORMAL LOW (ref >39.00)
LDL Cholesterol: 116 mg/dL — ABNORMAL HIGH (ref 0–99)
NonHDL: 146.85
Total CHOL/HDL Ratio: 5
Triglycerides: 153 mg/dL — ABNORMAL HIGH (ref 0.0–149.0)
VLDL: 30.6 mg/dL (ref 0.0–40.0)

## 2019-07-18 MED ORDER — EZETIMIBE 10 MG PO TABS: 10.0000 mg | ORAL_TABLET | Freq: Every day | ORAL | 0 refills | Status: DC

## 2019-07-18 MED ORDER — ATENOLOL 50 MG PO TABS: 50.0000 mg | ORAL_TABLET | Freq: Every day | ORAL | 0 refills | Status: DC

## 2019-07-18 MED ORDER — RAMIPRIL 5 MG PO CAPS: 5.0000 mg | ORAL_CAPSULE | Freq: Every day | ORAL | 0 refills | Status: DC

## 2019-07-18 NOTE — Patient Instructions (Signed)
GO TO THE LAB : Get the blood work     [place] Schedule nurse visit to be done in 10 days, we need to recheck your blood pressure  Schedule your next appointment   with me in 6 months   Schedule a nurse visit to 3 months from today for the shingles shot booster  Check the  blood pressure 3-4 times a week BP GOAL is between 110/65 and  135/85. If it is consistently higher or lower, let me know

## 2019-07-18 NOTE — Progress Notes (Signed)
Subjective:    Patient ID: Seth Baker, male    DOB: 08-19-1956, 63 y.o.   MRN: [id number]  DOS:  07/18/2019 Type of visit - description: Here for CPX Reports good compliance with medication, BP noted to be elevated, he is asymptomatic  BP Readings from Last 3 Encounters:  07/18/19 (!) 173/78  07/14/18 128/84  07/12/17 118/68     Review of Systems Specifically denies headaches, chest pain, difficulty breathing or lower extremity edema  Other than above, a 14 point review of systems is negative    Past Medical History:  Diagnosis Date  . Hyperlipidemia   . Hypertension     Past Surgical History:  Procedure Laterality Date  . HERNIA REPAIR  90s   Bilaterally  . NASAL SINUS SURGERY    . Perianal lesion removal  2015   Dr. Johney Maine  . ROTATOR CUFF REPAIR Left 07/2017    Social History   Socioeconomic History  . Marital status: Divorced    Spouse name: Not on file  . Number of children: 3  . Years of education: Not on file  . Highest education level: Not on file  Occupational History  . Occupation: Surveyor, quantity  Social Needs  . Financial resource strain: Not on file  . Food insecurity    Worry: Not on file    Inability: Not on file  . Transportation needs    Medical: Not on file    Non-medical: Not on file  Tobacco Use  . Smoking status: Never Smoker  . Smokeless tobacco: Never Used  Substance and Sexual Activity  . Alcohol use: Yes    Comment: socially   . Drug use: No  . Sexual activity: Not on file  Lifestyle  . Physical activity    Days per week: Not on file    Minutes per session: Not on file  . Stress: Not on file  Relationships  . Social Herbalist on phone: Not on file    Gets together: Not on file    Attends religious service: Not on file    Active member of club or organization: Not on file    Attends meetings of clubs or organizations: Not on file    Relationship status: Not on file  . Intimate partner violence    Fear of  current or ex partner: Not on file    Emotionally abused: Not on file    Physically abused: Not on file    Forced sexual activity: Not on file  Other Topics Concern  . Not on file  Social History Narrative   divorced 2020, lives w/ older son   Has  3 children ,   Pt is  from [place]         Family History  Problem Relation Age of Onset  . Stroke Mother 46       lives in [place]   . CAD Neg Hx   . Diabetes Neg Hx   . Colon cancer Neg Hx   . Prostate cancer Neg Hx     Allergies as of 07/18/2019      Reactions   Atorvastatin    Not sure of reaction   Niacin    Redness & itching      Medication List       Accurate as of July 18, 2019 11:59 PM. If you have any questions, ask your nurse or doctor.        aspirin 81 MG tablet  Take 81 mg by mouth daily.   atenolol 50 MG tablet Commonly known as: TENORMIN Take 1 tablet (50 mg total) by mouth daily.   ezetimibe 10 MG tablet Commonly known as: ZETIA Take 1 tablet (10 mg total) by mouth daily.   FLAXSEED OIL PO Take 1 tablet by mouth 3 (three) times daily.   ramipril 5 MG capsule Commonly known as: ALTACE Take 1 capsule (5 mg total) by mouth daily.           Objective:   Physical Exam BP (!) 173/78 (BP Location: Left Arm, Patient Position: Sitting, Cuff Size: Small)   Pulse (!) 51   Temp (!) 96.9 F (36.1 C) (Temporal)   Resp 16   Ht 5\' 8"  (1.727 m)   Wt 168 lb 8 oz (76.4 kg)   SpO2 100%   BMI 25.62 kg/m  General: Well developed, NAD, BMI noted Neck: No  thyromegaly Carotid pulses present, no bruit at. HEENT:  Normocephalic . Face symmetric, atraumatic Lungs:  CTA B Normal respiratory effort, no intercostal retractions, no accessory muscle use. Heart: RRR,  no murmur.  No pretibial edema bilaterally  Abdomen:  Not distended, soft, non-tender. No rebound or rigidity.  Small umbilical hernia noted. Skin: Exposed areas without rash. Not pale. Not jaundice Neurologic:  alert &  oriented X3.  Speech normal, gait appropriate for age and unassisted Strength symmetric and appropriate for age.  Psych: Cognition and judgment appear intact.  Cooperative with normal attention span and concentration.  Behavior appropriate. No anxious or depressed appearing.     Assessment      Assessment  HTN Hyperlipidemia intolerant to atorvastatin and niacin  06-2014 carotid US normal  (1-39%)  PLAN: Here for CPX HTN: Currently on atenolol, Altace 5 mg, BP elevated today, rechecked BP: still elevated ~ 180s range.  No ambulatory BPs.  Reports is doing okay with salt intake and he remains active. It is the first time his BP is elevated. Plan: No change, nurse visit in 10 days, adjust BP meds if needed Hyperlipidemia: On Zetia, checking labs Commercial screening ([facility]): + Mild left carotid artery disease, and also osteoporosis, last year was referred for a bone density test and a carotid artery disease, referrals failed Plan: Nurse visit in 10 days for BP recheck Next visit for our routine checkup 6 months

## 2019-07-18 NOTE — Progress Notes (Signed)
Pre visit review using our clinic review tool, if applicable. No additional management support is needed unless otherwise documented below in the visit note. 

## 2019-07-19 NOTE — Assessment & Plan Note (Signed)
Here for CPX HTN: Currently on atenolol, Altace 5 mg, BP elevated today, rechecked BP: still elevated ~ 180s range.  No ambulatory BPs.  Reports is doing okay with salt intake and he remains active. It is the first time his BP is elevated. Plan: No change, nurse visit in 10 days, adjust BP meds if needed Hyperlipidemia: On Zetia, checking labs Commercial screening ([facility]): + Mild left carotid artery disease, and also osteoporosis, last year was referred for a bone density test and a carotid artery disease, referrals failed Plan: Nurse visit in 10 days for BP recheck Next visit for our routine checkup 6 months

## 2019-07-19 NOTE — Assessment & Plan Note (Signed)
-  Tdap 2012 - Shingles immunization: See last year comments, was unable to get Zostavax, will proceed with Shingrix No. 1 today. -CCS: virtual colonoscopy 5-07: neg Colonoscopy 08/2014 Due for a colonoscopy, declined the referral, plans to proceed summer 2021 -Prostate ca screening: DRE PSA 2019 wnl -Doing well with lifestyle  - labs CMP, FLP, CBC

## 2019-07-27 ENCOUNTER — Ambulatory Visit (INDEPENDENT_AMBULATORY_CARE_PROVIDER_SITE_OTHER): Payer: BC Managed Care – PPO | Admitting: Internal Medicine

## 2019-07-27 ENCOUNTER — Other Ambulatory Visit: Payer: Self-pay

## 2019-07-27 VITALS — BP 133/77 | HR 50

## 2019-07-27 DIAGNOSIS — I10 Essential (primary) hypertension: Secondary | ICD-10-CM | POA: Diagnosis not present

## 2019-07-27 NOTE — Progress Notes (Signed)
Pt here for Blood pressure check per Dr. Larose Kells.  Pt currently takes: atenolol and Altace.   Pt reports compliance with medication.  BP today @ = 133/77 HR = 50  Patient brought in machine and with his machine blood pressure was  BP: 143/79 HR: 52  Pt advised per Dr. Larose Kells blood pressure looks good today. Continue same dose of medication and make sure blood pressures are in the range of 130s/70s.  Appointment made for 6 months for follow up and he will call around the end of December to make appointment for second shingles vaccine.

## 2019-09-07 ENCOUNTER — Encounter: Payer: Self-pay | Admitting: Internal Medicine

## 2019-09-07 DIAGNOSIS — Z20828 Contact with and (suspected) exposure to other viral communicable diseases: Secondary | ICD-10-CM | POA: Diagnosis not present

## 2019-09-07 DIAGNOSIS — Z1159 Encounter for screening for other viral diseases: Secondary | ICD-10-CM | POA: Diagnosis not present

## 2019-10-17 ENCOUNTER — Other Ambulatory Visit: Payer: Self-pay

## 2019-10-18 ENCOUNTER — Other Ambulatory Visit: Payer: Self-pay

## 2019-10-18 ENCOUNTER — Ambulatory Visit (INDEPENDENT_AMBULATORY_CARE_PROVIDER_SITE_OTHER): Payer: BC Managed Care – PPO | Admitting: *Deleted

## 2019-10-18 DIAGNOSIS — Z23 Encounter for immunization: Secondary | ICD-10-CM

## 2019-10-18 NOTE — Progress Notes (Signed)
Patient here for second shingles vaccine.  Vaccine given in left deltoid and patient tolerate well.

## 2019-11-28 ENCOUNTER — Other Ambulatory Visit: Payer: Self-pay

## 2019-11-28 ENCOUNTER — Encounter: Payer: Self-pay | Admitting: Internal Medicine

## 2019-11-28 ENCOUNTER — Ambulatory Visit (INDEPENDENT_AMBULATORY_CARE_PROVIDER_SITE_OTHER): Payer: BC Managed Care – PPO | Admitting: Internal Medicine

## 2019-11-28 VITALS — BP 144/83 | HR 63 | Ht 68.0 in | Wt 168.0 lb

## 2019-11-28 DIAGNOSIS — I10 Essential (primary) hypertension: Secondary | ICD-10-CM

## 2019-11-28 NOTE — Progress Notes (Signed)
Subjective:    Patient ID: Seth Baker, male    DOB: 06/28/1956, 64 y.o.   MRN: [id number]  DOS:  11/28/2019 Type of visit - description: Virtual Visit via Telephone  Attempted  to make this a video visit, due to technical difficulties from the patient side it was not possible  thus we proceeded with a Virtual Visit via Telephone    I connected with above mentioned patient  by telephone and verified that I am speaking with the correct person using two identifiers.  THIS ENCOUNTER IS A VIRTUAL VISIT DUE TO COVID-19 - PATIENT WAS NOT SEEN IN THE OFFICE. PATIENT HAS CONSENTED TO VIRTUAL VISIT / TELEMEDICINE VISIT   Location of patient: home  Location of provider: office  I discussed the limitations, risks, security and privacy concerns of performing an evaluation and management service by telephone and the availability of in person appointments. I also discussed with the patient that there may be a patient responsible charge related to this service. The patient expressed understanding and agreed to proceed.  Acute Has been checking his blood pressure in the last 2 weeks and it is consistently in the 150s, even 160s. Today however is okay at 144/83. He feels well.   Review of Systems Has been very active lately putting a new hardwood floor at his house.  Denies emotional distress Not taking NSAIDs No headache, chest pain, shortness of breath or lower extremity edema  Past Medical History:  Diagnosis Date  . Hyperlipidemia   . Hypertension     Past Surgical History:  Procedure Laterality Date  . HERNIA REPAIR  90s   Bilaterally  . NASAL SINUS SURGERY    . Perianal lesion removal  2015   Dr. Johney Maine  . ROTATOR CUFF REPAIR Left 07/2017    Allergies as of 11/28/2019      Reactions   Atorvastatin    Not sure of reaction   Niacin    Redness & itching      Medication List       Accurate as of November 28, 2019 10:40 AM. If you have any questions, ask your nurse or doctor.        aspirin 81 MG tablet Take 81 mg by mouth daily.   atenolol 50 MG tablet Commonly known as: TENORMIN Take 1 tablet (50 mg total) by mouth daily.   ezetimibe 10 MG tablet Commonly known as: ZETIA Take 1 tablet (10 mg total) by mouth daily.   FLAXSEED OIL PO Take 1 tablet by mouth 3 (three) times daily.   ramipril 5 MG capsule Commonly known as: ALTACE Take 1 capsule (5 mg total) by mouth daily.             Objective:   Physical Exam BP (!) 144/83   Pulse 63   Ht 5\' 8"  (1.727 m)   Wt 168 lb (76.2 kg)   BMI 25.54 kg/m  This is a telephone virtual visit, he sounded well, in no distress    Assessment      Assessment  HTN Hyperlipidemia intolerant to atorvastatin and niacin  06-2014 carotid US normal  (1-39%)  PLAN: HTN: Not well controlled, started to check BPs 2 weeks ago, they are consistently in the 150s, 160s without DBP in the 80s.  On atenolol 50 mg and ramipril 5 mg daily.  The last time he was here in November, his BP was good, heart rate 50. Plan: Increase ramipril to 10 mg, if that works we  will send a prescription, continue monitoring BPs, call if not at goal, BMP in 10 to 14 days.  We will call the patient and set up. Keep well-hydrated.     I discussed the assessment and treatment plan with the patient. The patient was provided an opportunity to ask questions and all were answered. The patient agreed with the plan and demonstrated an understanding of the instructions.   The patient was advised to call back or seek an in-person evaluation if the symptoms worsen or if the condition fails to improve as anticipated.  I provided 15  minutes of non-face-to-face time during this encounter.  Kathlene November, MD    .

## 2019-11-28 NOTE — Progress Notes (Signed)
Pre visit review using our clinic review tool, if applicable. No additional management support is needed unless otherwise documented below in the visit note. 

## 2019-11-29 NOTE — Assessment & Plan Note (Signed)
HTN: Not well controlled, started to check BPs 2 weeks ago, they are consistently in the 150s, 160s without DBP in the 80s.  On atenolol 50 mg and ramipril 5 mg daily.  The last time he was here in November, his BP was good, heart rate 50. Plan: Increase ramipril to 10 mg, if that works we will send a prescription, continue monitoring BPs, call if not at goal, BMP in 10 to 14 days.  We will call the patient and set up. Keep well-hydrated.

## 2019-12-11 ENCOUNTER — Other Ambulatory Visit: Payer: BC Managed Care – PPO

## 2019-12-11 ENCOUNTER — Ambulatory Visit: Payer: BC Managed Care – PPO | Admitting: Internal Medicine

## 2019-12-12 ENCOUNTER — Other Ambulatory Visit (INDEPENDENT_AMBULATORY_CARE_PROVIDER_SITE_OTHER): Payer: BC Managed Care – PPO

## 2019-12-12 ENCOUNTER — Other Ambulatory Visit: Payer: Self-pay

## 2019-12-12 DIAGNOSIS — I10 Essential (primary) hypertension: Secondary | ICD-10-CM

## 2019-12-12 LAB — BASIC METABOLIC PANEL
BUN: 12 mg/dL (ref 6–23)
CO2: 29 mEq/L (ref 19–32)
Calcium: 9.2 mg/dL (ref 8.4–10.5)
Chloride: 106 mEq/L (ref 96–112)
Creatinine, Ser: 0.93 mg/dL (ref 0.40–1.50)
GFR: 81.73 mL/min (ref >60.00)
Glucose, Bld: 98 mg/dL (ref 70–99)
Potassium: 4.1 mEq/L (ref 3.5–5.1)
Sodium: 138 mEq/L (ref 135–145)

## 2019-12-15 MED ORDER — ATENOLOL 50 MG PO TABS: 50.0000 mg | ORAL_TABLET | Freq: Every day | ORAL | 1 refills | Status: DC

## 2019-12-15 MED ORDER — RAMIPRIL 5 MG PO CAPS: 10.0000 mg | ORAL_CAPSULE | Freq: Every day | ORAL | 1 refills | Status: DC

## 2019-12-15 MED ORDER — EZETIMIBE 10 MG PO TABS: 10.0000 mg | ORAL_TABLET | Freq: Every day | ORAL | 3 refills | Status: DC

## 2019-12-15 NOTE — Addendum Note (Signed)
Addended byDamita Dunnings D on: 12/15/2019 01:14 PM   Modules accepted: Orders

## 2020-01-16 ENCOUNTER — Encounter: Payer: Self-pay | Admitting: Internal Medicine

## 2020-01-16 ENCOUNTER — Other Ambulatory Visit: Payer: Self-pay

## 2020-01-16 ENCOUNTER — Ambulatory Visit (INDEPENDENT_AMBULATORY_CARE_PROVIDER_SITE_OTHER): Payer: BC Managed Care – PPO | Admitting: Internal Medicine

## 2020-01-16 VITALS — BP 173/79 | HR 51 | Temp 96.5°F | Resp 16 | Ht 68.0 in | Wt 159.2 lb

## 2020-01-16 DIAGNOSIS — I10 Essential (primary) hypertension: Secondary | ICD-10-CM | POA: Diagnosis not present

## 2020-01-16 MED ORDER — HYDROCHLOROTHIAZIDE 25 MG PO TABS: 25.0000 mg | ORAL_TABLET | Freq: Every day | ORAL | 0 refills | Status: DC

## 2020-01-16 NOTE — Progress Notes (Signed)
Pre visit review using our clinic review tool, if applicable. No additional management support is needed unless otherwise documented below in the visit note. 

## 2020-01-16 NOTE — Progress Notes (Signed)
Subjective:    Patient ID: Seth Baker, male    DOB: 1955/12/06, 64 y.o.   MRN: [id number]  DOS:  01/16/2020 Type of visit - description: Follow-up Today we talk about hypertension and Covid vaccination. Good compliance with medications, no apparent side effects, ambulatory BP readings reviewed.  Wt Readings from Last 3 Encounters:  01/16/20 159 lb 4 oz (72.2 kg)  11/28/19 168 lb (76.2 kg)  07/18/19 168 lb 8 oz (76.4 kg)   Review of Systems Denies chest pain no difficulty breathing He remains active, some weight loss noted Denies major problems with stress.  Past Medical History:  Diagnosis Date  . Hyperlipidemia   . Hypertension     Past Surgical History:  Procedure Laterality Date  . HERNIA REPAIR  90s   Bilaterally  . NASAL SINUS SURGERY    . Perianal lesion removal  2015   Dr. Johney Maine  . ROTATOR CUFF REPAIR Left 07/2017    Allergies as of 01/16/2020      Reactions   Atorvastatin    Not sure of reaction   Niacin    Redness & itching      Medication List       Accurate as of January 16, 2020  8:06 AM. If you have any questions, ask your nurse or doctor.        aspirin 81 MG tablet Take 81 mg by mouth daily.   atenolol 50 MG tablet Commonly known as: TENORMIN Take 1 tablet (50 mg total) by mouth daily.   ezetimibe 10 MG tablet Commonly known as: ZETIA Take 1 tablet (10 mg total) by mouth daily.   FLAXSEED OIL PO Take 1 tablet by mouth 3 (three) times daily.   ramipril 5 MG capsule Commonly known as: ALTACE Take 2 capsules (10 mg total) by mouth daily.         Objective:   Physical Exam BP (!) 173/79 (BP Location: Left Arm, Patient Position: Sitting, Cuff Size: Small)   Pulse (!) 51   Temp (!) 96.5 F (35.8 C) (Temporal)   Resp 16   Ht 5\' 8"  (1.727 m)   Wt 159 lb 4 oz (72.2 kg)   SpO2 99%   BMI 24.21 kg/m  General:   Well developed, NAD, BMI noted. HEENT:  Normocephalic . Face symmetric, atraumatic Lungs:  CTA B Normal  respiratory effort, no intercostal retractions, no accessory muscle use. Heart: RRR,  no murmur.  Lower extremities: no pretibial edema bilaterally  Skin: Not pale. Not jaundice Neurologic:  alert & oriented X3.  Speech normal, gait appropriate for age and unassisted Psych--  Cognition and judgment appear intact.  Cooperative with normal attention span and concentration.  Behavior appropriate. No anxious or depressed appearing.      Assessment      Assessment  HTN Hyperlipidemia intolerant to atorvastatin and niacin  06-2014 carotid US normal  (1-39%)  PLAN: HTN: Not optimally controlled. He is on atenolol and at the last visit we increase ramipril to 10 mg.  Ambulatory BPs have improved but is still range from the 140s to 153/83.  Mostly in the 140s. Room for improvement, add HCTZ, 25 mg daily.  BMP in 2 weeks. Once we have his BP control we will try to consolidate medications in less pills.   Covid vaccination.  After second Shingrix shot he had chills that lasted about 30 minutes, no rash, no itching.  This is not contraindication to Covid vaccination, encouraged to proceed. RTC 2  weeks BMP RTC CPX 6 months   This visit occurred during the SARS-CoV-2 public health emergency.  Safety protocols were in place, including screening questions prior to the visit, additional usage of staff PPE, and extensive cleaning of exam room while observing appropriate contact time as indicated for disinfecting solutions.

## 2020-01-16 NOTE — Patient Instructions (Addendum)
COVID-19 Vaccine Information can be found at: ShippingScam.co.uk For questions related to vaccine distribution or appointments, please email [email or web address] or call [phone].     Add hydrochlorothiazide 1 tablet daily  Check your blood pressure 3 times a week at least BP GOAL is between 110/65 and  135/85. If it is consistently higher or lower, let me know  Call in 1 week and let me know how your blood pressure is doing.  If your blood pressure goals too low we can decrease hydrochlorothiazide to half tablet daily   GO TO THE FRONT DESK, PLEASE SCHEDULE YOUR APPOINTMENTS Come back for blood work in 2 weeks  Come back for a physical exam 06-2020

## 2020-01-16 NOTE — Assessment & Plan Note (Signed)
HTN: Not optimally controlled. He is on atenolol and at the last visit we increase ramipril to 10 mg.  Ambulatory BPs have improved but is still range from the 140s to 153/83.  Mostly in the 140s. Room for improvement, add HCTZ, 25 mg daily.  BMP in 2 weeks. Once we have his BP control we will try to consolidate medications in less pills.   Covid vaccination.  After second Shingrix shot he had chills that lasted about 30 minutes, no rash, no itching.  This is not contraindication to Covid vaccination, encouraged to proceed. RTC 2 weeks BMP RTC CPX 6 months

## 2020-01-24 ENCOUNTER — Telehealth: Payer: Self-pay | Admitting: Internal Medicine

## 2020-01-24 NOTE — Telephone Encounter (Signed)
Pt called w/ BP readings: BP higher in AM vs PM.   148/80 126/73 151/81 143/78 124/78 120/69 132/80 123/74 117/72  Pt did not record pulse rates- instructed to do so to make sure his pulse is not to low. Pt verbalized understanding. He has lab appt to recheck BMP on 01/30/20.

## 2020-01-24 NOTE — Telephone Encounter (Signed)
Numbers look good, plan is the same.

## 2020-01-30 ENCOUNTER — Other Ambulatory Visit: Payer: BC Managed Care – PPO

## 2020-01-31 ENCOUNTER — Other Ambulatory Visit: Payer: BC Managed Care – PPO

## 2020-02-01 ENCOUNTER — Other Ambulatory Visit: Payer: Self-pay

## 2020-02-01 ENCOUNTER — Other Ambulatory Visit (INDEPENDENT_AMBULATORY_CARE_PROVIDER_SITE_OTHER): Payer: BC Managed Care – PPO

## 2020-02-01 DIAGNOSIS — I10 Essential (primary) hypertension: Secondary | ICD-10-CM | POA: Diagnosis not present

## 2020-02-01 LAB — BASIC METABOLIC PANEL
BUN: 12 mg/dL (ref 6–23)
CO2: 31 mEq/L (ref 19–32)
Calcium: 9.4 mg/dL (ref 8.4–10.5)
Chloride: 100 mEq/L (ref 96–112)
Creatinine, Ser: 1.03 mg/dL (ref 0.40–1.50)
GFR: 72.61 mL/min (ref >60.00)
Glucose, Bld: 109 mg/dL — ABNORMAL HIGH (ref 70–99)
Potassium: 4.1 mEq/L (ref 3.5–5.1)
Sodium: 139 mEq/L (ref 135–145)

## 2020-02-01 MED ORDER — HYDROCHLOROTHIAZIDE 25 MG PO TABS: 25.0000 mg | ORAL_TABLET | Freq: Every day | ORAL | 1 refills | Status: DC

## 2020-02-01 NOTE — Addendum Note (Signed)
Addended byDamita Dunnings D on: 02/01/2020 03:17 PM   Modules accepted: Orders

## 2020-03-22 DIAGNOSIS — H9190 Unspecified hearing loss, unspecified ear: Secondary | ICD-10-CM | POA: Diagnosis not present

## 2020-03-22 DIAGNOSIS — H6982 Other specified disorders of Eustachian tube, left ear: Secondary | ICD-10-CM | POA: Diagnosis not present

## 2020-03-22 DIAGNOSIS — H9312 Tinnitus, left ear: Secondary | ICD-10-CM | POA: Diagnosis not present

## 2020-03-22 DIAGNOSIS — Z77122 Contact with and (suspected) exposure to noise: Secondary | ICD-10-CM | POA: Diagnosis not present

## 2020-04-12 ENCOUNTER — Encounter: Payer: Self-pay | Admitting: Internal Medicine

## 2020-04-18 DIAGNOSIS — H903 Sensorineural hearing loss, bilateral: Secondary | ICD-10-CM | POA: Diagnosis not present

## 2020-04-18 DIAGNOSIS — Z77122 Contact with and (suspected) exposure to noise: Secondary | ICD-10-CM | POA: Diagnosis not present

## 2020-04-18 DIAGNOSIS — H9319 Tinnitus, unspecified ear: Secondary | ICD-10-CM | POA: Diagnosis not present

## 2020-06-17 ENCOUNTER — Other Ambulatory Visit: Payer: Self-pay | Admitting: Internal Medicine

## 2020-07-09 ENCOUNTER — Ambulatory Visit (INDEPENDENT_AMBULATORY_CARE_PROVIDER_SITE_OTHER): Payer: BC Managed Care – PPO | Admitting: Internal Medicine

## 2020-07-09 ENCOUNTER — Other Ambulatory Visit: Payer: Self-pay

## 2020-07-09 ENCOUNTER — Encounter: Payer: Self-pay | Admitting: Internal Medicine

## 2020-07-09 VITALS — BP 154/82 | HR 56 | Temp 97.7°F | Resp 16 | Ht 68.0 in | Wt 153.2 lb

## 2020-07-09 DIAGNOSIS — Z23 Encounter for immunization: Secondary | ICD-10-CM

## 2020-07-09 DIAGNOSIS — Z0000 Encounter for general adult medical examination without abnormal findings: Secondary | ICD-10-CM

## 2020-07-09 DIAGNOSIS — Z1211 Encounter for screening for malignant neoplasm of colon: Secondary | ICD-10-CM

## 2020-07-09 DIAGNOSIS — I10 Essential (primary) hypertension: Secondary | ICD-10-CM

## 2020-07-09 DIAGNOSIS — E785 Hyperlipidemia, unspecified: Secondary | ICD-10-CM

## 2020-07-09 DIAGNOSIS — I779 Disorder of arteries and arterioles, unspecified: Secondary | ICD-10-CM | POA: Diagnosis not present

## 2020-07-09 MED ORDER — RAMIPRIL 10 MG PO CAPS: 10.0000 mg | ORAL_CAPSULE | Freq: Every day | ORAL | 2 refills | Status: DC

## 2020-07-09 NOTE — Progress Notes (Signed)
Pre visit review using our clinic review tool, if applicable. No additional management support is needed unless otherwise documented below in the visit note. 

## 2020-07-09 NOTE — Patient Instructions (Addendum)
Check the  blood pressure weekly BP GOAL is between 110/65 and  135/85. If it is consistently higher or lower, let me know   GENERAL INFORMATION ABOUT HEALTHY EATING, CHOLESTEROL, HIGH BLOOD PRESSURE, QUIT TOBACCO (if you smoke):  The American Heart Association, www.heart.org  Check the "Life's Simple 7" from the [facility] The American diabetes Association www.diabetes.org  "[facility] [place]" book  It with your Covid booster by February or March 2022  GO TO THE LAB : Get the blood work     [place], [place] back for   a checkup in 3 months

## 2020-07-09 NOTE — Progress Notes (Signed)
Subjective:    Patient ID: Seth Baker, male    DOB: 08/20/1956, 64 y.o.   MRN: [id number]  DOS:  07/09/2020 Type of visit - description: CPX Denies any concerns, feels well.   Review of Systems    A 14 point review of systems is negative    Past Medical History:  Diagnosis Date  . Hyperlipidemia   . Hypertension     Past Surgical History:  Procedure Laterality Date  . HERNIA REPAIR  90s   Bilaterally  . NASAL SINUS SURGERY    . Perianal lesion removal  2015   Dr. Johney Maine  . ROTATOR CUFF REPAIR Left 07/2017    Allergies as of 07/09/2020      Reactions   Atorvastatin    Not sure of reaction   Niacin    Redness & itching      Medication List       Accurate as of July 09, 2020 11:59 PM. If you have any questions, ask your nurse or doctor.        aspirin 81 MG tablet Take 81 mg by mouth daily.   atenolol 50 MG tablet Commonly known as: TENORMIN Take 1 tablet (50 mg total) by mouth daily.   ezetimibe 10 MG tablet Commonly known as: ZETIA Take 1 tablet (10 mg total) by mouth daily.   FLAXSEED OIL PO Take 1 tablet by mouth 3 (three) times daily.   hydrochlorothiazide 25 MG tablet Commonly known as: HYDRODIURIL Take 1 tablet (25 mg total) by mouth daily.   ramipril 10 MG capsule Commonly known as: Altace Take 1 capsule (10 mg total) by mouth daily. What changed: medication strength Changed by: Kathlene November, MD          Objective:   Physical Exam BP (!) 154/82 (BP Location: Left Arm, Patient Position: Sitting, Cuff Size: Small)   Pulse (!) 56   Temp 97.7 F (36.5 C) (Oral)   Resp 16   Ht 5\' 8"  (1.727 m)   Wt 153 lb 4 oz (69.5 kg)   SpO2 100%   BMI 23.30 kg/m  General: Well developed, NAD, BMI noted Neck: No  thyromegaly.  Palpable carotid pulses bilaterally HEENT:  Normocephalic . Face symmetric, atraumatic Lungs:  CTA B Normal respiratory effort, no intercostal retractions, no accessory muscle use. Heart: RRR,  no murmur.    Abdomen:  Not distended, soft, non-tender. No rebound or rigidity.   Lower extremities: no pretibial edema bilaterally  Skin: Exposed areas without rash. Not pale. Not jaundice DRE: Normal sphincter tone, normal stools, normal prostate. Neurologic:  alert & oriented X3.  Speech normal, gait appropriate for age and unassisted Strength symmetric and appropriate for age.  Psych: Cognition and judgment appear intact.  Cooperative with normal attention span and concentration.  Behavior appropriate. No anxious or depressed appearing.     Assessment     Assessment  HTN Hyperlipidemia intolerant to atorvastatin and niacin  06-2014 carotid US  (1-39%), October 2019 1 to 39% stable.  PLAN: Here for CPX HTN: On Tenormin, HCTZ, Altace.  BP today is elevated, he checks it sporadically at home and is in the 130s noting that is typically higher in the mornings. We will check labs, continue same meds but recommend to check BPs weekly, has an arm cuff, let me know if not at goal, reassess in 3 months. EKG today: Sinus bradycardia (asx) Carotid disease?  Had a commercial screening test done, everything was okay except question of mild  left carotid disease.  Due to repeat carotid ultrasound.  Will order.   RTC 3 months    This visit occurred during the SARS-CoV-2 public health emergency.  Safety protocols were in place, including screening questions prior to the visit, additional usage of staff PPE, and extensive cleaning of exam room while observing appropriate contact time as indicated for disinfecting solutions.

## 2020-07-10 ENCOUNTER — Encounter: Payer: Self-pay | Admitting: Internal Medicine

## 2020-07-10 LAB — CBC WITH DIFFERENTIAL/PLATELET
Absolute Monocytes: 462 cells/uL (ref 200–950)
Basophils Absolute: 52 cells/uL (ref 0–200)
Basophils Relative: 0.8 %
Eosinophils Absolute: 150 cells/uL (ref 15–500)
Eosinophils Relative: 2.3 %
HCT: 46.8 % (ref 38.5–50.0)
Hemoglobin: 16.2 g/dL (ref 13.2–17.1)
Lymphs Abs: 1138 cells/uL (ref 850–3900)
MCH: 32.1 pg (ref 27.0–33.0)
MCHC: 34.6 g/dL (ref 32.0–36.0)
MCV: 92.9 fL (ref 80.0–100.0)
MPV: 11.2 fL (ref 7.5–12.5)
Monocytes Relative: 7.1 %
Neutro Abs: 4700 cells/uL (ref 1500–7800)
Neutrophils Relative %: 72.3 %
Platelets: 292 10*3/uL (ref 140–400)
RBC: 5.04 10*6/uL (ref 4.20–5.80)
RDW: 12.1 % (ref 11.0–15.0)
Total Lymphocyte: 17.5 %
WBC: 6.5 10*3/uL (ref 3.8–10.8)

## 2020-07-10 LAB — COMPREHENSIVE METABOLIC PANEL
AG Ratio: 1.6 (calc) (ref 1.0–2.5)
ALT: 18 U/L (ref 9–46)
AST: 20 U/L (ref 10–35)
Albumin: 4.7 g/dL (ref 3.6–5.1)
Alkaline phosphatase (APISO): 64 U/L (ref 35–144)
BUN: 16 mg/dL (ref 7–25)
CO2: 29 mmol/L (ref 20–32)
Calcium: 10.3 mg/dL (ref 8.6–10.3)
Chloride: 100 mmol/L (ref 98–110)
Creat: 1.13 mg/dL (ref 0.70–1.25)
Globulin: 3 g/dL (calc) (ref 1.9–3.7)
Glucose, Bld: 111 mg/dL — ABNORMAL HIGH (ref 65–99)
Potassium: 5.1 mmol/L (ref 3.5–5.3)
Sodium: 139 mmol/L (ref 135–146)
Total Bilirubin: 1.4 mg/dL — ABNORMAL HIGH (ref 0.2–1.2)
Total Protein: 7.7 g/dL (ref 6.1–8.1)

## 2020-07-10 LAB — PSA: PSA: 3.21 ng/mL (ref <=4.0)

## 2020-07-10 LAB — LIPID PANEL
Cholesterol: 209 mg/dL — ABNORMAL HIGH (ref <200)
HDL: 39 mg/dL — ABNORMAL LOW (ref >=40)
LDL Cholesterol (Calc): 140 mg/dL (calc) — ABNORMAL HIGH
Non-HDL Cholesterol (Calc): 170 mg/dL (calc) — ABNORMAL HIGH (ref <130)
Total CHOL/HDL Ratio: 5.4 (calc) — ABNORMAL HIGH (ref <5.0)
Triglycerides: 162 mg/dL — ABNORMAL HIGH (ref <150)

## 2020-07-10 LAB — TSH: TSH: 3.82 mIU/L (ref 0.40–4.50)

## 2020-07-10 NOTE — Assessment & Plan Note (Signed)
Here for CPX HTN: On Tenormin, HCTZ, Altace.  BP today is elevated, he checks it sporadically at home and is in the 130s noting that is typically higher in the mornings. We will check labs, continue same meds but recommend to check BPs weekly, has an arm cuff, let me know if not at goal, reassess in 3 months. EKG today: Sinus bradycardia (asx) Carotid disease?  Had a commercial screening test done, everything was okay except question of mild left carotid disease.  Due to repeat carotid ultrasound.  Will order.   RTC 3 months

## 2020-07-10 NOTE — Assessment & Plan Note (Signed)
-  Tdap 2012 -  S/p shingrix - s/p pfizer covid x 2, last  03/2020, recommend booster by February or March 2022  - flu shot today -CCS: virtual colonoscopy 5-07: neg Colonoscopy 08/2014 Due for a colonoscopy, GI referral -Prostate ca screening: DRE normal, check PSA -Since he retires doing well, more time to exercise, diet remains healthy - labs CMP, FLP, CBC, TSH, PSA.

## 2020-07-12 MED ORDER — PRAVASTATIN SODIUM 20 MG PO TABS: 20.0000 mg | ORAL_TABLET | Freq: Every day | ORAL | 3 refills | Status: DC

## 2020-07-12 NOTE — Addendum Note (Signed)
Addended byDamita Dunnings D on: 07/12/2020 08:34 AM   Modules accepted: Orders

## 2020-07-15 ENCOUNTER — Other Ambulatory Visit: Payer: Self-pay | Admitting: Internal Medicine

## 2020-08-02 ENCOUNTER — Telehealth: Payer: Self-pay | Admitting: Internal Medicine

## 2020-08-02 NOTE — Telephone Encounter (Signed)
Patient states he has a carotid artery ultrasound scheduled. Is going to cost him around $900. He is wondering if he could wait a month or so. He will be in medicare next month.

## 2020-08-02 NOTE — Telephone Encounter (Signed)
Absolutely, to let us know when ready

## 2020-08-02 NOTE — Telephone Encounter (Signed)
Spoke w/ Pt- informed of PCP recommendations. Pt verbalized understanding.  

## 2020-08-02 NOTE — Telephone Encounter (Signed)
Please advise 

## 2020-08-05 ENCOUNTER — Ambulatory Visit (HOSPITAL_BASED_OUTPATIENT_CLINIC_OR_DEPARTMENT_OTHER): Payer: BC Managed Care – PPO

## 2020-08-20 ENCOUNTER — Other Ambulatory Visit: Payer: Self-pay

## 2020-08-20 MED ORDER — PRAVASTATIN SODIUM 20 MG PO TABS: 20.0000 mg | ORAL_TABLET | Freq: Every day | ORAL | 0 refills | Status: DC

## 2020-08-23 ENCOUNTER — Ambulatory Visit (HOSPITAL_BASED_OUTPATIENT_CLINIC_OR_DEPARTMENT_OTHER)
Admission: RE | Admit: 2020-08-23 | Discharge: 2020-08-23 | Disposition: A | Discharge status: Home / Self Care | Payer: Medicare Other | Source: Ambulatory Visit | Attending: Internal Medicine | Admitting: Internal Medicine

## 2020-08-23 ENCOUNTER — Other Ambulatory Visit: Payer: Self-pay

## 2020-08-23 DIAGNOSIS — I779 Disorder of arteries and arterioles, unspecified: Secondary | ICD-10-CM | POA: Diagnosis present

## 2020-08-26 ENCOUNTER — Other Ambulatory Visit: Payer: Self-pay

## 2020-08-26 ENCOUNTER — Ambulatory Visit (INDEPENDENT_AMBULATORY_CARE_PROVIDER_SITE_OTHER): Payer: Medicare Other | Admitting: Internal Medicine

## 2020-08-26 VITALS — BP 148/77 | HR 66 | Temp 97.9°F | Ht 68.0 in | Wt 158.0 lb

## 2020-08-26 DIAGNOSIS — R739 Hyperglycemia, unspecified: Secondary | ICD-10-CM | POA: Diagnosis not present

## 2020-08-26 DIAGNOSIS — I779 Disorder of arteries and arterioles, unspecified: Secondary | ICD-10-CM | POA: Diagnosis not present

## 2020-08-26 DIAGNOSIS — I10 Essential (primary) hypertension: Secondary | ICD-10-CM

## 2020-08-26 DIAGNOSIS — E785 Hyperlipidemia, unspecified: Secondary | ICD-10-CM | POA: Diagnosis not present

## 2020-08-26 LAB — LIPID PANEL
Cholesterol: 150 mg/dL (ref 0–200)
HDL: 35.4 mg/dL — ABNORMAL LOW (ref >39.00)
LDL Cholesterol: 85 mg/dL (ref 0–99)
NonHDL: 114.27
Total CHOL/HDL Ratio: 4
Triglycerides: 147 mg/dL (ref 0.0–149.0)
VLDL: 29.4 mg/dL (ref 0.0–40.0)

## 2020-08-26 LAB — HEMOGLOBIN A1C: Hgb A1c MFr Bld: 5.4 % (ref 4.6–6.5)

## 2020-08-26 LAB — ALT: ALT: 22 U/L (ref 0–53)

## 2020-08-26 LAB — AST: AST: 23 U/L (ref 0–37)

## 2020-08-26 MED ORDER — AMLODIPINE BESYLATE 5 MG PO TABS: 5.0000 mg | ORAL_TABLET | Freq: Every day | ORAL | 6 refills | Status: DC

## 2020-08-26 NOTE — Progress Notes (Signed)
Subjective:    Patient ID: Seth Baker, male    DOB: 1956/01/16, 64 y.o.   MRN: [id number]  DOS:  08/26/2020 Type of visit - description: f/u   Here to discuss hypertension and hyperlipidemia. Doing well with lifestyle. Has no other concerns  Review of Systems See above   Past Medical History:  Diagnosis Date  . Hyperlipidemia   . Hypertension     Past Surgical History:  Procedure Laterality Date  . HERNIA REPAIR  90s   Bilaterally  . NASAL SINUS SURGERY    . Perianal lesion removal  2015   Dr. Johney Maine  . ROTATOR CUFF REPAIR Left 07/2017    Allergies as of 08/26/2020      Reactions   Atorvastatin    Not sure of reaction   Niacin    Redness & itching      Medication List       Accurate as of August 26, 2020 11:59 PM. If you have any questions, ask your nurse or doctor.        STOP taking these medications   hydrochlorothiazide 25 MG tablet Commonly known as: HYDRODIURIL Stopped by: Kathlene November, MD     TAKE these medications   amLODipine 5 MG tablet Commonly known as: NORVASC Take 1 tablet (5 mg total) by mouth daily. Started by: Kathlene November, MD   aspirin 81 MG tablet Take 81 mg by mouth daily.   atenolol 50 MG tablet Commonly known as: TENORMIN Take 1 tablet (50 mg total) by mouth daily.   ezetimibe 10 MG tablet Commonly known as: ZETIA Take 1 tablet (10 mg total) by mouth daily.   FLAXSEED OIL PO Take 1 tablet by mouth 3 (three) times daily.   pravastatin 20 MG tablet Commonly known as: PRAVACHOL Take 1 tablet (20 mg total) by mouth at bedtime.   ramipril 10 MG capsule Commonly known as: Altace Take 1 capsule (10 mg total) by mouth daily.          Objective:   Physical Exam BP (!) 148/77 (BP Location: Right Arm, Patient Position: Sitting, Cuff Size: Large)   Pulse 66   Temp 97.9 F (36.6 C) (Oral)   Ht 5\' 8"  (1.727 m)   Wt 158 lb (71.7 kg)   SpO2 98%   BMI 24.02 kg/m  General:   Well developed, NAD, BMI noted. HEENT:   Normocephalic . Face symmetric, atraumatic Lungs:  CTA B Normal respiratory effort, no intercostal retractions, no accessory muscle use. Heart: RRR,  no murmur.  Lower extremities: no pretibial edema bilaterally  Skin: Not pale. Not jaundice Neurologic:  alert & oriented X3.  Speech normal, gait appropriate for age and unassisted Psych--  Cognition and judgment appear intact.  Cooperative with normal attention span and concentration.  Behavior appropriate. No anxious or depressed appearing.      Assessment     Assessment  HTN Hyperlipidemia intolerant to atorvastatin and niacin  06-2014 carotid US  (1-39%), October 2019 and December 2021: 1-39% stable.   PLAN: HTN: Ambulatory BPs typically 136/70, afternoon BPs better than in the morning.  On looking back, his cholesterol increase after HCTZ started. Plan: Continue Altace, atenolol, DC HCTZ, add amlodipine. High cholesterol: Based on last FLP, in addition to Zetia started Pravachol with no side effects, check FLP, AST, ALT Carotid artery disease: Mild, ultrasound showing bilateral 1 to 39% plaque formation, he reports occasionally blind spot in front of his eyes for the last few years.  Rec  regular visits with ophthalmology, control CV RF. Mild hyperglycemia: Per chart review, check A1c RTC 4 months.    This visit occurred during the SARS-CoV-2 public health emergency.  Safety protocols were in place, including screening questions prior to the visit, additional usage of staff PPE, and extensive cleaning of exam room while observing appropriate contact time as indicated for disinfecting solutions.

## 2020-08-26 NOTE — Patient Instructions (Signed)
Stop hydrochlorothiazide (might have increased your cholesterol) Start amlodipine 5 mg 1 tablet daily.  Check the  blood pressure regularly BP GOAL is between 110/65 and  135/85. If it is consistently higher or lower, let me know   GO TO THE LAB : Get the blood work     [place], [place] back for   a checkup in 4 months

## 2020-08-27 NOTE — Assessment & Plan Note (Signed)
HTN: Ambulatory BPs typically 136/70, afternoon BPs better than in the morning.  On looking back, his cholesterol increase after HCTZ started. Plan: Continue Altace, atenolol, DC HCTZ, add amlodipine. High cholesterol: Based on last FLP, in addition to Zetia started Pravachol with no side effects, check FLP, AST, ALT Carotid artery disease: Mild, ultrasound showing bilateral 1 to 39% plaque formation, he reports occasionally blind spot in front of his eyes for the last few years.  Rec regular visits with ophthalmology, control CV RF. Mild hyperglycemia: Per chart review, check A1c RTC 4 months.

## 2020-10-02 ENCOUNTER — Telehealth: Payer: Self-pay | Admitting: Internal Medicine

## 2020-10-02 MED ORDER — AMLODIPINE BESYLATE 5 MG PO TABS
5.0000 mg | ORAL_TABLET | Freq: Every day | ORAL | 1 refills | Status: DC
Start: 2020-10-02 — End: 2020-11-05

## 2020-10-02 NOTE — Telephone Encounter (Signed)
Rx sent 

## 2020-10-02 NOTE — Telephone Encounter (Signed)
Caller: [place]  Call Back # [phone]  Patient states he would like his medication to go to [facility]   @ FAX [phone]  Medication: amLODipine (NORVASC) 5 MG tablet [[id number]]   Has the patient contacted their pharmacy? No. (If no, request that the patient contact the pharmacy for the refill.) (If yes, when and what did the pharmacy advise?)  Preferred Pharmacy (with phone number or street name): [facility], [place] [address]  Agent: Please be advised that RX refills may take up to 3 business days. We ask that you follow-up with your pharmacy.

## 2020-10-10 ENCOUNTER — Ambulatory Visit: Payer: BC Managed Care – PPO | Admitting: Internal Medicine

## 2020-11-05 ENCOUNTER — Telehealth: Payer: Self-pay | Admitting: Internal Medicine

## 2020-11-05 MED ORDER — AMLODIPINE BESYLATE 5 MG PO TABS
5.0000 mg | ORAL_TABLET | Freq: Every day | ORAL | 1 refills | Status: DC
Start: 2020-11-05 — End: 2021-02-12

## 2020-11-05 MED ORDER — PRAVASTATIN SODIUM 20 MG PO TABS: 20.0000 mg | ORAL_TABLET | Freq: Every day | ORAL | 1 refills | Status: DC

## 2020-11-05 MED ORDER — EZETIMIBE 10 MG PO TABS: 10.0000 mg | ORAL_TABLET | Freq: Every day | ORAL | 1 refills | Status: DC

## 2020-11-05 MED ORDER — ATENOLOL 50 MG PO TABS: 50.0000 mg | ORAL_TABLET | Freq: Every day | ORAL | 1 refills | Status: DC

## 2020-11-05 MED ORDER — RAMIPRIL 10 MG PO CAPS: 10.0000 mg | ORAL_CAPSULE | Freq: Every day | ORAL | 1 refills | Status: DC

## 2020-11-05 NOTE — Telephone Encounter (Signed)
Patient states that he need all medications (new scripts)  sent to [facility] Fax Number @ [phone]  Patient has recently change pharmacies.

## 2020-11-05 NOTE — Telephone Encounter (Signed)
Rxs sent

## 2020-12-25 ENCOUNTER — Encounter: Payer: Self-pay | Admitting: Internal Medicine

## 2020-12-25 ENCOUNTER — Other Ambulatory Visit: Payer: Self-pay

## 2020-12-25 ENCOUNTER — Ambulatory Visit (INDEPENDENT_AMBULATORY_CARE_PROVIDER_SITE_OTHER): Payer: Medicare Other | Admitting: Internal Medicine

## 2020-12-25 VITALS — BP 156/80 | HR 56 | Temp 98.2°F | Resp 16 | Ht 68.0 in | Wt 154.1 lb

## 2020-12-25 DIAGNOSIS — I10 Essential (primary) hypertension: Secondary | ICD-10-CM

## 2020-12-25 DIAGNOSIS — Z136 Encounter for screening for cardiovascular disorders: Secondary | ICD-10-CM | POA: Diagnosis not present

## 2020-12-25 DIAGNOSIS — I2510 Atherosclerotic heart disease of native coronary artery without angina pectoris: Secondary | ICD-10-CM

## 2020-12-25 DIAGNOSIS — E785 Hyperlipidemia, unspecified: Secondary | ICD-10-CM

## 2020-12-25 DIAGNOSIS — I2583 Coronary atherosclerosis due to lipid rich plaque: Secondary | ICD-10-CM

## 2020-12-25 LAB — BASIC METABOLIC PANEL
BUN: 19 mg/dL (ref 6–23)
CO2: 28 mEq/L (ref 19–32)
Calcium: 9.6 mg/dL (ref 8.4–10.5)
Chloride: 103 mEq/L (ref 96–112)
Creatinine, Ser: 1.15 mg/dL (ref 0.40–1.50)
GFR: 66.88 mL/min (ref >60.00)
Glucose, Bld: 94 mg/dL (ref 70–99)
Potassium: 4.7 mEq/L (ref 3.5–5.1)
Sodium: 138 mEq/L (ref 135–145)

## 2020-12-25 NOTE — Progress Notes (Signed)
Subjective:    Patient ID: EMIR NACK, male    DOB: 1956/02/23, 65 y.o.   MRN: [id number]  DOS:  12/25/2020 Type of visit - description: F/U  Since the last office visit he is doing well. We reviewed his ambulatory BPs He has a number of questions about cholesterol and cardiovascular risk.   Review of Systems See above   Past Medical History:  Diagnosis Date  . Hyperlipidemia   . Hypertension     Past Surgical History:  Procedure Laterality Date  . HERNIA REPAIR  90s   Bilaterally  . NASAL SINUS SURGERY    . Perianal lesion removal  2015   Dr. Johney Maine  . ROTATOR CUFF REPAIR Left 07/2017    Allergies as of 12/25/2020      Reactions   Atorvastatin    Not sure of reaction   Niacin    Redness & itching      Medication List       Accurate as of December 25, 2020  4:55 PM. If you have any questions, ask your nurse or doctor.        amLODipine 5 MG tablet Commonly known as: NORVASC Take 1 tablet (5 mg total) by mouth daily.   aspirin 81 MG tablet Take 81 mg by mouth daily.   atenolol 50 MG tablet Commonly known as: TENORMIN Take 1 tablet (50 mg total) by mouth daily.   ezetimibe 10 MG tablet Commonly known as: ZETIA Take 1 tablet (10 mg total) by mouth daily.   FLAXSEED OIL PO Take 1 tablet by mouth 3 (three) times daily.   pravastatin 20 MG tablet Commonly known as: PRAVACHOL Take 1 tablet (20 mg total) by mouth at bedtime.   ramipril 10 MG capsule Commonly known as: Altace Take 1 capsule (10 mg total) by mouth daily.          Objective:   Physical Exam BP (!) 156/80 (BP Location: Left Arm, Patient Position: Sitting, Cuff Size: Small)   Pulse (!) 56   Temp 98.2 F (36.8 C) (Oral)   Resp 16   Ht 5\' 8"  (1.727 m)   Wt 154 lb 2 oz (69.9 kg)   SpO2 96%   BMI 23.43 kg/m  General:   Well developed, NAD, BMI noted. HEENT:  Normocephalic . Face symmetric, atraumatic Lungs:  CTA B Normal respiratory effort, no intercostal retractions, no  accessory muscle use. Heart: RRR,  no murmur.  Lower extremities: no pretibial edema bilaterally  Skin: Not pale. Not jaundice Neurologic:  alert & oriented X3.  Speech normal, gait appropriate for age and unassisted Psych--  Cognition and judgment appear intact.  Cooperative with normal attention span and concentration.  Behavior appropriate. No anxious or depressed appearing.      Assessment     Assessment  HTN Hyperlipidemia intolerant to atorvastatin and niacin  06-2014 carotid US  (1-39%), October 2019 and December 2021: 1-39% stable.   PLAN: HTN: Ambulatory BPs are mostly in the 120s, 578I with diastolic BP in the 69G and 70s.  Occasionally systolic BP in the 295M mostly in the morning. Continue amlodipine, Tenormin, Altace.  Check BMP and continue monitoring BPs. High cholesterol: He is compliant with Zetia and Pravachol, from time to time he has ache or pain or a stomach problem, according to his own research on Internet that could be s/e from Pravachol.  Rec  to visit the [facility] website for reliable medical information, if he develops actual persistent myalgias  he will let me know. CV RF: His 10-year risk is 20.4%, already on statin and Zetia.  Interested on a calcium coronary score, will try to arrange.  That could help further management of cholesterol medication Preventive care: Declined Prevnar RTC 6 months    This visit occurred during the SARS-CoV-2 public health emergency.  Safety protocols were in place, including screening questions prior to the visit, additional usage of staff PPE, and extensive cleaning of exam room while observing appropriate contact time as indicated for disinfecting solutions.

## 2020-12-25 NOTE — Assessment & Plan Note (Signed)
HTN: Ambulatory BPs are mostly in the 120s, 461J with diastolic BP in the 01Q and 70s.  Occasionally systolic BP in the 224V mostly in the morning. Continue amlodipine, Tenormin, Altace.  Check BMP and continue monitoring BPs. High cholesterol: He is compliant with Zetia and Pravachol, from time to time he has ache or pain or a stomach problem, according to his own research on Internet that could be s/e from Pravachol.  Rec  to visit the [facility] website for reliable medical information, if he develops actual persistent myalgias he will let me know. CV RF: His 10-year risk is 20.4%, already on statin and Zetia.  Interested on a calcium coronary score, will try to arrange.  That could help further management of cholesterol medication Preventive care: Declined Prevnar RTC 6 months

## 2020-12-25 NOTE — Patient Instructions (Addendum)
Continue checking your blood pressures regularly BP GOAL is between 110/65 and  135/85. If it is consistently higher or lower, let me know  We will order calcium coronary score, CAT scan of your chest to check your heart. If your insurance does not cover it, you can pay out-of-pocket. If you are unable to schedule it please let us know or call [facility] : Get the blood work     [place], [place] back for routine visit in 6 months  If you like reliable medical information, please visit the [facility] website

## 2020-12-31 ENCOUNTER — Ambulatory Visit (HOSPITAL_BASED_OUTPATIENT_CLINIC_OR_DEPARTMENT_OTHER)
Admission: RE | Admit: 2020-12-31 | Discharge: 2020-12-31 | Disposition: A | Discharge status: Home / Self Care | Payer: Medicare Other | Source: Ambulatory Visit | Attending: Internal Medicine | Admitting: Internal Medicine

## 2020-12-31 ENCOUNTER — Other Ambulatory Visit: Payer: Self-pay

## 2020-12-31 DIAGNOSIS — Z136 Encounter for screening for cardiovascular disorders: Secondary | ICD-10-CM | POA: Insufficient documentation

## 2020-12-31 DIAGNOSIS — I10 Essential (primary) hypertension: Secondary | ICD-10-CM | POA: Insufficient documentation

## 2020-12-31 DIAGNOSIS — E785 Hyperlipidemia, unspecified: Secondary | ICD-10-CM | POA: Insufficient documentation

## 2021-01-01 MED ORDER — ROSUVASTATIN CALCIUM 10 MG PO TABS: 10.0000 mg | ORAL_TABLET | Freq: Every day | ORAL | 0 refills | Status: DC

## 2021-01-01 NOTE — Addendum Note (Signed)
Addended byDamita Dunnings D on: 01/01/2021 10:01 AM   Modules accepted: Orders

## 2021-01-02 ENCOUNTER — Telehealth: Payer: Self-pay | Admitting: Internal Medicine

## 2021-01-02 NOTE — Telephone Encounter (Signed)
Spoke w/ Pt- he had a question about his cholesterol medication. He thought Dr. Larose Kells was placing him on Zocor or simvastatin and he did some online research and knew amlodipine and simvastatin shouldn't be taken together. Informed that PCP placed him on rosuvastatin or Crestor. Pt apologized that he misheard and thanked me for call.

## 2021-01-02 NOTE — Telephone Encounter (Signed)
Caller : Seth Baker  Call Back @ [phone]   Patient would like a call back in reference to Dr Larose Kells changing his blood pressure medication.  Please advise

## 2021-01-02 NOTE — Telephone Encounter (Signed)
We have no changes BP meds.

## 2021-01-13 DIAGNOSIS — I10 Essential (primary) hypertension: Secondary | ICD-10-CM | POA: Insufficient documentation

## 2021-01-13 DIAGNOSIS — E785 Hyperlipidemia, unspecified: Secondary | ICD-10-CM | POA: Insufficient documentation

## 2021-01-16 ENCOUNTER — Other Ambulatory Visit: Payer: Self-pay

## 2021-01-16 ENCOUNTER — Ambulatory Visit (INDEPENDENT_AMBULATORY_CARE_PROVIDER_SITE_OTHER): Payer: Medicare Other | Admitting: Cardiology

## 2021-01-16 VITALS — BP 140/82 | HR 55 | Ht 70.0 in | Wt 153.0 lb

## 2021-01-16 DIAGNOSIS — E782 Mixed hyperlipidemia: Secondary | ICD-10-CM

## 2021-01-16 DIAGNOSIS — I10 Essential (primary) hypertension: Secondary | ICD-10-CM

## 2021-01-16 DIAGNOSIS — I2510 Atherosclerotic heart disease of native coronary artery without angina pectoris: Secondary | ICD-10-CM | POA: Diagnosis not present

## 2021-01-16 MED ORDER — ATENOLOL 50 MG PO TABS: ORAL_TABLET | ORAL | 1 refills | Status: DC

## 2021-01-16 NOTE — Progress Notes (Signed)
Cardiology Consultation:    Date:  01/16/2021   ID:  Seth Baker Nov 30, 1955, MRN [id number]  PCP:  Colon Branch, MD  Cardiologist:  Jenne Campus, MD   Referring MD: Colon Branch, MD   Chief Complaint  Patient presents with  . Coronary Artery Disease    Lipid rich plaque    History of Present Illness:    Seth Baker is a 65 y.o. male who is being seen today for the evaluation of I have abnormal calcium score at the request of Colon Branch, MD.  He is originally from [place], his past medical history significant for essential hypertension, dyslipidemia.  Recently he ended up having calcium score done calcium score is elevated 542.  He was sent to [place] for evaluation for this problem.  Overall he is doing very well.  About a year ago he retired and he is working a lot around his property he is walking he is cutting wood he is carrying wood he got 0 symptoms except for some shortness of breath.  Denies have any chest pain tightness squeezing pressure burning chest there is no dizziness no passing out no swelling of lower extremities he sleeps well and overall is doing quite well. There is no family history of premature coronary artery disease He never smoked Trying to eat well avoidance of red meat he eats a lot of salad. Is very concerned about his heart and health overall.  And doing quite well.  Since that time he retired he is much more active.  Past Medical History:  Diagnosis Date  . Annual physical exam 06/09/2011  . HTN (hypertension) 01/13/2007   Qualifier: Diagnosis of  By: Cletus Gash MD, [place]    . Hyperlipidemia   . HYPERLIPIDEMIA 04/22/2007   Annotation: Previously declined therapy Qualifier: Diagnosis of  By: Cletus Gash MD, [place]    . Hypertension   . SHOULDER PAIN, LEFT 01/31/2008   Centricity Description: SHOULDER PAIN, LEFT Qualifier: Diagnosis of  By: Larose Kells MD, [place]  Centricity Description: SHOULDER PAIN, RIGHT Qualifier: Diagnosis of  By: Larose Kells MD, [place] 11/29/2009   Qualifier: Diagnosis of  By: Larose Kells MD, [place] Tubular adenoma of colon 09/26/2014    Past Surgical History:  Procedure Laterality Date  . HERNIA REPAIR  90s   Bilaterally  . NASAL SINUS SURGERY    . Perianal lesion removal  2015   Dr. Johney Maine  . ROTATOR CUFF REPAIR Left 07/2017    Current Medications: Current Meds  Medication Sig  . amLODipine (NORVASC) 5 MG tablet Take 1 tablet (5 mg total) by mouth daily.  Marland Kitchen aspirin 81 MG tablet Take 81 mg by mouth daily.  Marland Kitchen atenolol (TENORMIN) 50 MG tablet Take 1 tablet (50 mg total) by mouth daily.  Marland Kitchen ezetimibe (ZETIA) 10 MG tablet Take 1 tablet (10 mg total) by mouth daily.  . Flaxseed, Linseed, (FLAXSEED OIL PO) Take 1 tablet by mouth 3 (three) times daily. Unknown strength  . ramipril (ALTACE) 10 MG capsule Take 1 capsule (10 mg total) by mouth daily.  . rosuvastatin (CRESTOR) 10 MG tablet Take 1 tablet (10 mg total) by mouth daily.     Allergies:   Atorvastatin and Niacin   Social History   Socioeconomic History  . Marital status: Divorced    Spouse name: Not on file  . Number of children: 3  . Years of education: Not on file  .  Highest education level: Not on file  Occupational History  . Occupation: retired Scientist, product/process development  Tobacco Use  . Smoking status: Never Smoker  . Smokeless tobacco: Never Used  Substance and Sexual Activity  . Alcohol use: Yes    Comment: socially   . Drug use: No  . Sexual activity: Not on file  Other Topics Concern  . Not on file  Social History Narrative   divorced 2020, lives w/ older son   Has  3 children ,   Pt is  from [place]       Social Determinants of Radio broadcast assistant Strain: Not on [employer] Insecurity: Not on file  Transportation Needs: Not on file  Physical Activity: Not on file  Stress: Not on file  Social Connections: Not on file     Family History: The patient's family history includes Stroke (age of onset: 77) in his mother.  There is no history of CAD, Diabetes, Colon cancer, or Prostate cancer. ROS:   Please see the history of present illness.    All 14 point review of systems negative except as described per history of present illness.  EKGs/Labs/Other Studies Reviewed:    The following studies were reviewed today:   EKG:  EKG is  ordered today.  The ekg ordered today demonstrates sinus bradycardia normal P interval normal QS complex duration functional ST segment changes  Recent Labs: 07/09/2020: Hemoglobin 16.2; Platelets 292; TSH 3.82 08/26/2020: ALT 22 12/25/2020: BUN 19; Creatinine, Ser 1.15; Potassium 4.7; Sodium 138  Recent Lipid Panel    Component Value Date/Time   CHOL 150 08/26/2020 0942   TRIG 147.0 08/26/2020 0942   TRIG 113 09/06/2006 0828   HDL 35.40 (L) 08/26/2020 0942   CHOLHDL 4 08/26/2020 0942   VLDL 29.4 08/26/2020 0942   LDLCALC 85 08/26/2020 0942   LDLCALC 140 (H) 07/09/2020 0945   LDLDIRECT 143.4 04/18/2007 0818    Physical Exam:    VS:  BP 140/82 (BP Location: Left Arm, Patient Position: Sitting)   Pulse (!) 55   Ht 5\' 10"  (1.778 m)   Wt 153 lb (69.4 kg)   SpO2 98%   BMI 21.95 kg/m     Wt Readings from Last 3 Encounters:  01/16/21 153 lb (69.4 kg)  12/25/20 154 lb 2 oz (69.9 kg)  08/26/20 158 lb (71.7 kg)     GEN:  Well nourished, well developed in no acute distress HEENT: Normal NECK: No JVD; No carotid bruits LYMPHATICS: No lymphadenopathy CARDIAC: RRR, no murmurs, no rubs, no gallops RESPIRATORY:  Clear to auscultation without rales, wheezing or rhonchi  ABDOMEN: Soft, non-tender, non-distended MUSCULOSKELETAL:  No edema; No deformity  SKIN: Warm and dry NEUROLOGIC:  Alert and oriented x 3 PSYCHIATRIC:  Normal affect   ASSESSMENT:    1. Primary hypertension   2. Coronary artery disease involving native coronary artery of native heart without angina pectoris   3. Mixed hyperlipidemia    PLAN:    In order of problems listed above:  1. Coronary  artery disease.  He does have abnormal calcium score of 542 but he is completely absolutely asymptomatic in spite of the fact he is working hard.  We did spend great deal of time talking about risk factors modifications.  He is already on aspirin which I will continue we did talk about basic of Mediterranean diet.  We did talk about need to exercise on the regular basis.  He said he likes swimming and  I strongly advised him to start going to [facility] swim on the regular basis.  Also advised him to get either apple watch or Fitbit to try to document objectively how much exercises he does.  We did discuss diet we did talk in length about Mediterranean diet and strongly recommended to keeping up with that.  I believe that he will be able to do that and by doing this will avoid problems in the future. 2. Essential hypertension, straight this is first visit in our office.  I will continue monitoring his blood pressure.  He takes Tenormin which ideally should be taken twice daily I asked him to cut down tolerating the half half in the morning half in the afternoon.  Hopefully that will keep his blood pressure in the morning on appropriate level.  I will ask him to have echocardiogram to assess left ventricle ejection fraction and look for left ventricle hypertrophy. 3. Dyslipidemia I do see his fasting lipid profile from last December.  He is on statin as well as Zetia which I will continue for now.  He is already on moderate intensity statin. 4. Key here is risk factors modification he is very open for my suggestions and I am sure he will do well I told him that he needs to let me know if he still having tightness squeezing pressure burning chest or 5. Shortness of breath. 6. Peripheral vascular disease: He does have carotic arterial stenosis but only up to 39% again decreased risk factors modifications   Medication Adjustments/Labs and Tests Ordered: Current medicines are reviewed at length with the patient today.   Concerns regarding medicines are outlined above.  No orders of the defined types were placed in this encounter.  No orders of the defined types were placed in this encounter.   Signed, Park Liter, MD, [facility]. 01/16/2021 2:23 PM    [place]

## 2021-01-16 NOTE — Progress Notes (Signed)
ekg 

## 2021-01-16 NOTE — Patient Instructions (Signed)
Medication Instructions:  Your physician has recommended you make the following change in your medication:   CHANGE: Atenolol to 1/2 tablet in the morning and 1/2 tablet in the evening daily.   *If you need a refill on your cardiac medications before your next appointment, please call your pharmacy*   Lab Work: None  If you have labs (blood work) drawn today and your tests are completely normal, you will receive your results only by: Marland Kitchen MyChart Message (if you have MyChart) OR . A paper copy in the mail If you have any lab test that is abnormal or we need to change your treatment, we will call you to review the results.   Testing/Procedures: Your physician has requested that you have an echocardiogram. Echocardiography is a painless test that uses sound waves to create images of your heart. It provides your doctor with information about the size and shape of your heart and how well your heart's chambers and valves are working. This procedure takes approximately one hour. There are no restrictions for this procedure.     Follow-Up: At [facility], you and your health needs are our priority.  As part of our continuing mission to provide you with exceptional heart care, we have created designated Provider Care Teams.  These Care Teams include your primary Cardiologist (physician) and Advanced Practice Providers (APPs -  Physician Assistants and Nurse Practitioners) who all work together to provide you with the care you need, when you need it.  We recommend signing up for the patient portal called "MyChart".  Sign up information is provided on this After Visit Summary.  MyChart is used to connect with patients for Virtual Visits (Telemedicine).  Patients are able to view lab/test results, encounter notes, upcoming appointments, etc.  Non-urgent messages can be sent to your provider as well.   To learn more about what you can do with MyChart, go to [email or web address].    Your next  appointment:   3 month(s)  The format for your next appointment:   In Person  Provider:   Jenne Campus, MD   Other Instructions   Echocardiogram An echocardiogram is a test that uses sound waves (ultrasound) to produce images of the heart. Images from an echocardiogram can provide important information about:  Heart size and shape.  The size and thickness and movement of your heart's walls.  Heart muscle function and strength.  Heart valve function or if you have stenosis. Stenosis is when the heart valves are too narrow.  If blood is flowing backward through the heart valves (regurgitation).  A tumor or infectious growth around the heart valves.  Areas of heart muscle that are not working well because of poor blood flow or injury from a heart attack.  Aneurysm detection. An aneurysm is a weak or damaged part of an artery wall. The wall bulges out from the normal force of blood pumping through the body. Tell a health care provider about:  Any allergies you have.  All medicines you are taking, including vitamins, herbs, eye drops, creams, and over-the-counter medicines.  Any blood disorders you have.  Any surgeries you have had.  Any medical conditions you have.  Whether you are pregnant or may be pregnant. What are the risks? Generally, this is a safe test. However, problems may occur, including an allergic reaction to dye (contrast) that may be used during the test. What happens before the test? No specific preparation is needed. You may eat and drink normally. What happens during  the test?  You will take off your clothes from the waist up and put on a hospital gown.  Electrodes or electrocardiogram (ECG)patches may be placed on your chest. The electrodes or patches are then connected to a device that monitors your heart rate and rhythm.  You will lie down on a table for an ultrasound exam. A gel will be applied to your chest to help sound waves pass  through your skin.  A handheld device, called a transducer, will be pressed against your chest and moved over your heart. The transducer produces sound waves that travel to your heart and bounce back (or "echo" back) to the transducer. These sound waves will be captured in real-time and changed into images of your heart that can be viewed on a video monitor. The images will be recorded on a computer and reviewed by your health care provider.  You may be asked to change positions or hold your breath for a short time. This makes it easier to get different views or better views of your heart.  In some cases, you may receive contrast through an IV in one of your veins. This can improve the quality of the pictures from your heart. The procedure may vary among health care providers and hospitals.   What can I expect after the test? You may return to your normal, everyday life, including diet, activities, and medicines, unless your health care provider tells you not to do that. Follow these instructions at home:  It is up to you to get the results of your test. Ask your health care provider, or the department that is doing the test, when your results will be ready.  Keep all follow-up visits. This is important. Summary  An echocardiogram is a test that uses sound waves (ultrasound) to produce images of the heart.  Images from an echocardiogram can provide important information about the size and shape of your heart, heart muscle function, heart valve function, and other possible heart problems.  You do not need to do anything to prepare before this test. You may eat and drink normally.  After the echocardiogram is completed, you may return to your normal, everyday life, unless your health care provider tells you not to do that. This information is not intended to replace advice given to you by your health care provider. Make sure you discuss any questions you have with your health care  provider. Document Revised: 04/30/2020 Document Reviewed: 04/30/2020 Elsevier Patient Education  2021 [employer].

## 2021-02-12 ENCOUNTER — Telehealth: Payer: Self-pay | Admitting: Internal Medicine

## 2021-02-12 MED ORDER — ROSUVASTATIN CALCIUM 10 MG PO TABS: 10.0000 mg | ORAL_TABLET | Freq: Every day | ORAL | 1 refills | Status: DC

## 2021-02-12 MED ORDER — RAMIPRIL 10 MG PO CAPS: 10.0000 mg | ORAL_CAPSULE | Freq: Every day | ORAL | 1 refills | Status: DC

## 2021-02-12 MED ORDER — EZETIMIBE 10 MG PO TABS: 10.0000 mg | ORAL_TABLET | Freq: Every day | ORAL | 1 refills | Status: DC

## 2021-02-12 MED ORDER — AMLODIPINE BESYLATE 5 MG PO TABS: 5.0000 mg | ORAL_TABLET | Freq: Every day | ORAL | 1 refills | Status: DC

## 2021-02-12 NOTE — Telephone Encounter (Signed)
Pt want a 90 day supplies of all prescription   Medication: amLODipine (NORVASC) 5 MG tablet [[id number]]     Has the patient contacted their pharmacy? no (If no, request that the patient contact the pharmacy for the refill.) (If yes, when and what did the pharmacy advise?)    Preferred Pharmacy (with phone number or street name):  [id number] - [place], [place] [address] Phone:  [phone]  Fax:  [phone]         Agent: Please be advised that RX refills may take up to 3 business days. We ask that you follow-up with your pharmacy.

## 2021-02-12 NOTE — Telephone Encounter (Signed)
Rxs sent

## 2021-02-18 ENCOUNTER — Other Ambulatory Visit: Payer: Self-pay

## 2021-02-18 ENCOUNTER — Ambulatory Visit (INDEPENDENT_AMBULATORY_CARE_PROVIDER_SITE_OTHER): Payer: Medicare Other

## 2021-02-18 DIAGNOSIS — I2510 Atherosclerotic heart disease of native coronary artery without angina pectoris: Secondary | ICD-10-CM | POA: Diagnosis not present

## 2021-02-18 LAB — ECHOCARDIOGRAM COMPLETE
Area-P 1/2: 4.26 cm2
S' Lateral: 2.8 cm

## 2021-02-18 NOTE — Progress Notes (Signed)
Complete echocardiogram w/strain performed.  Jimmy Cleva Camero RDCS, RVT

## 2021-02-19 ENCOUNTER — Telehealth: Payer: Self-pay | Admitting: Cardiology

## 2021-02-19 NOTE — Telephone Encounter (Signed)
Called patient informed him of results again.

## 2021-02-19 NOTE — Telephone Encounter (Signed)
Patient is requesting to go over his echo results again.

## 2021-04-17 ENCOUNTER — Other Ambulatory Visit: Payer: Self-pay

## 2021-04-17 ENCOUNTER — Encounter: Payer: Self-pay | Admitting: Cardiology

## 2021-04-17 ENCOUNTER — Ambulatory Visit (INDEPENDENT_AMBULATORY_CARE_PROVIDER_SITE_OTHER): Payer: Medicare Other | Admitting: Cardiology

## 2021-04-17 VITALS — BP 140/82 | HR 53 | Ht 70.0 in | Wt 154.4 lb

## 2021-04-17 DIAGNOSIS — I2510 Atherosclerotic heart disease of native coronary artery without angina pectoris: Secondary | ICD-10-CM

## 2021-04-17 DIAGNOSIS — E782 Mixed hyperlipidemia: Secondary | ICD-10-CM

## 2021-04-17 DIAGNOSIS — I10 Essential (primary) hypertension: Secondary | ICD-10-CM | POA: Diagnosis not present

## 2021-04-17 LAB — AST: AST: 26 IU/L (ref 0–40)

## 2021-04-17 LAB — LIPID PANEL
Chol/HDL Ratio: 2.4 ratio (ref 0.0–5.0)
Cholesterol, Total: 100 mg/dL (ref 100–199)
HDL: 41 mg/dL (ref >39)
LDL Chol Calc (NIH): 43 mg/dL (ref 0–99)
Triglycerides: 78 mg/dL (ref 0–149)
VLDL Cholesterol Cal: 16 mg/dL (ref 5–40)

## 2021-04-17 LAB — ALT: ALT: 29 IU/L (ref 0–44)

## 2021-04-17 NOTE — Patient Instructions (Signed)
Medication Instructions:  Your physician recommends that you continue on your current medications as directed. Please refer to the Current Medication list given to you today.  *If you need a refill on your cardiac medications before your next appointment, please call your pharmacy*   Lab Work: Your physician recommends that you return for lab work in: TODAY Lipids, AST, ALT If you have labs (blood work) drawn today and your tests are completely normal, you will receive your results only by: [place] (if you have MyChart) OR A paper copy in the mail If you have any lab test that is abnormal or we need to change your treatment, we will call you to review the results.   Testing/Procedures: None   Follow-Up: At [facility], you and your health needs are our priority.  As part of our continuing mission to provide you with exceptional heart care, we have created designated Provider Care Teams.  These Care Teams include your primary Cardiologist (physician) and Advanced Practice Providers (APPs -  Physician Assistants and Nurse Practitioners) who all work together to provide you with the care you need, when you need it.  We recommend signing up for the patient portal called "MyChart".  Sign up information is provided on this After Visit Summary.  MyChart is used to connect with patients for Virtual Visits (Telemedicine).  Patients are able to view lab/test results, encounter notes, upcoming appointments, etc.  Non-urgent messages can be sent to your provider as well.   To learn more about what you can do with MyChart, go to [email or web address].    Your next appointment:   6 month(s)  The format for your next appointment:   In Person  Provider:   Jenne Campus, MD   Other Instructions

## 2021-04-17 NOTE — Progress Notes (Signed)
Cardiology Office Note:    Date:  04/17/2021   ID:  Baker, Seth 02-18-1956, MRN [id number]  PCP:  Colon Branch, MD  Cardiologist:  Jenne Campus, MD    Referring MD: Colon Branch, MD   Chief Complaint  Patient presents with   Follow-up  I am doing well, I am completely asymptomatic  History of Present Illness:    Seth Baker is a 65 y.o. male who is referred to [place] because of abnormal calcium score.  His calcium score is 540.  In spite of that he is absolutely completely asymptomatic he is trying to be more active, he is trying to be also more comfortable for his exercises.  I wanted him to have a fit bit however he did not buy it yet but he does check number of steps he does on his phone and usually is more than 10,000.  Denies have any chest pain tightness squeezing pressure burning chest no palpitations no dizziness.  He is also trying to modify his diet last time we discussed basic of Mediterranean diet.  I encouraged him to exercise on the regular basis.  Also last time we cut down his atenolol in half he takes half tablet in the morning half tablet in the afternoon.  Past Medical History:  Diagnosis Date   Annual physical exam 06/09/2011   HTN (hypertension) 01/13/2007   Qualifier: Diagnosis of  By: Cletus Gash MD, [place]     Hyperlipidemia    HYPERLIPIDEMIA 04/22/2007   Annotation: Previously declined therapy Qualifier: Diagnosis of  By: Cletus Gash MD, [place]     Hypertension    SHOULDER PAIN, LEFT 01/31/2008   Centricity Description: SHOULDER PAIN, LEFT Qualifier: Diagnosis of  By: Larose Kells MD, [place]  Centricity Description: SHOULDER PAIN, RIGHT Qualifier: Diagnosis of  By: Larose Kells MD, [place] LESION 11/29/2009   Qualifier: Diagnosis of  By: Larose Kells MD, [place]    Tubular adenoma of colon 09/26/2014    Past Surgical History:  Procedure Laterality Date   HERNIA REPAIR  90s   Bilaterally   NASAL SINUS SURGERY     Perianal lesion removal  2015   Dr. Johney Maine   ROTATOR CUFF  REPAIR Left 07/2017    Current Medications: Current Meds  Medication Sig   amLODipine (NORVASC) 5 MG tablet Take 1 tablet (5 mg total) by mouth daily.   aspirin 81 MG tablet Take 81 mg by mouth daily.   atenolol (TENORMIN) 50 MG tablet Take 1/2 tablet in the morning and 1/2 tablet in the evening daily. (Patient taking differently: Take 25 mg by mouth 2 (two) times daily. Take 1/2 tablet in the morning and 1/2 tablet in the evening daily.)   ezetimibe (ZETIA) 10 MG tablet Take 1 tablet (10 mg total) by mouth daily.   Flaxseed, Linseed, (FLAXSEED OIL PO) Take 1 tablet by mouth 3 (three) times daily. Unknown strength   ramipril (ALTACE) 10 MG capsule Take 1 capsule (10 mg total) by mouth daily.   rosuvastatin (CRESTOR) 10 MG tablet Take 1 tablet (10 mg total) by mouth daily.     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  Tobacco Use  Smoking status: Never   Smokeless tobacco: Never  Substance and Sexual Activity   Alcohol use: Yes    Comment: socially    Drug use: No   Sexual activity: Not on file  Other Topics Concern   Not on file  Social History Narrative   divorced 2020, lives w/ older son   Has  3 children ,   Pt is  from [place]       Social Determinants of Radio broadcast assistant Strain: Not on file  Food Insecurity: Not on file  Transportation Needs: Not on file  Physical Activity: Not on file  Stress: Not on file  Social Connections: Not on file     Family History: The patient's family history includes Stroke (age of onset: 43) in his mother. There is no history of CAD, Diabetes, Colon cancer, or Prostate cancer. ROS:   Please see the history of present illness.    All 14 point review of systems  negative except as described per history of present illness  EKGs/Labs/Other Studies Reviewed:      Recent Labs: 07/09/2020: Hemoglobin 16.2; Platelets 292; TSH 3.82 08/26/2020: ALT 22 12/25/2020: BUN 19; Creatinine, Ser 1.15; Potassium 4.7; Sodium 138  Recent Lipid Panel    Component Value Date/Time   CHOL 150 08/26/2020 0942   TRIG 147.0 08/26/2020 0942   TRIG 113 09/06/2006 0828   HDL 35.40 (L) 08/26/2020 0942   CHOLHDL 4 08/26/2020 0942   VLDL 29.4 08/26/2020 0942   LDLCALC 85 08/26/2020 0942   LDLCALC 140 (H) 07/09/2020 0945   LDLDIRECT 143.4 04/18/2007 0818    Physical Exam:    VS:  BP 140/82 (BP Location: Right Arm, Patient Position: Sitting)   Pulse (!) 53   Ht '5\' 10"'$  (1.778 m)   Wt 154 lb 6.4 oz (70 kg)   SpO2 99%   BMI 22.15 kg/m     Wt Readings from Last 3 Encounters:  04/17/21 154 lb 6.4 oz (70 kg)  01/16/21 153 lb (69.4 kg)  12/25/20 154 lb 2 oz (69.9 kg)     GEN:  Well nourished, well developed in no acute distress HEENT: Normal NECK: No JVD; No carotid bruits LYMPHATICS: No lymphadenopathy CARDIAC: RRR, no murmurs, no rubs, no gallops RESPIRATORY:  Clear to auscultation without rales, wheezing or rhonchi  ABDOMEN: Soft, non-tender, non-distended MUSCULOSKELETAL:  No edema; No deformity  SKIN: Warm and dry LOWER EXTREMITIES: no swelling NEUROLOGIC:  Alert and oriented x 3 PSYCHIATRIC:  Normal affect   ASSESSMENT:    1. Mixed hyperlipidemia   2. Coronary artery disease involving native coronary artery of native heart without angina pectoris   3. Primary hypertension    PLAN:    In order of problems listed above:  Mixed dyslipidemia I did review his K PN which show me his LDL of 85 HDL 35.  This is on Zetia however no Crestor now he is taking also 10 mg Crestor we will recheck his fasting lipid profile today.  It is absolutely essential to keep his cholesterol under control. Coronary disease of manifested by calcium score being 540.  I did talk  to him about potentially doing stress testing.  He does not want to because he is completely asymptomatic and honestly I cannot argue with that.  I told him if he develop any chest pain, tightness, pressure in the chest he need to let me know as soon as possible. Essential hypertension slightly elevated today in the office but he  said he check it at home usually [place] of 130/80.  I asked him to keep an eye on this if you have high blood pressure we need to modify his medications.   Medication Adjustments/Labs and Tests Ordered: Current medicines are reviewed at length with the patient today.  Concerns regarding medicines are outlined above.  Orders Placed This Encounter  Procedures   AST   ALT   Lipid panel   Medication changes: No orders of the defined types were placed in this encounter.   Signed, Park Liter, MD, [facility] 04/17/2021 9:09 AM    [place]

## 2021-04-18 ENCOUNTER — Telehealth: Payer: Self-pay

## 2021-04-18 NOTE — Telephone Encounter (Signed)
-----   Message from Park Liter, MD sent at 04/18/2021  8:34 AM EDT ----- Cholesterol looks perfect, liver function test normal.  Continue present management

## 2021-04-18 NOTE — Telephone Encounter (Signed)
Spoke with patient regarding results and recommendation.  Patient verbalizes understanding and is agreeable to plan of care. Advised patient to call back with any issues or concerns.  

## 2021-04-18 NOTE — Telephone Encounter (Signed)
Patient was returning call 

## 2021-04-18 NOTE — Telephone Encounter (Signed)
Left message on patients voicemail to please return our call.   

## 2021-05-30 ENCOUNTER — Encounter: Payer: Self-pay | Admitting: Internal Medicine

## 2021-06-10 ENCOUNTER — Other Ambulatory Visit: Payer: Self-pay

## 2021-06-10 ENCOUNTER — Ambulatory Visit (AMBULATORY_SURGERY_CENTER): Payer: Medicare Other

## 2021-06-10 VITALS — Ht 70.0 in | Wt 160.0 lb

## 2021-06-10 DIAGNOSIS — Z86010 Personal history of colonic polyps: Secondary | ICD-10-CM

## 2021-06-10 MED ORDER — PLENVU 140 G PO SOLR: 1.0000 | ORAL | 0 refills | Status: DC

## 2021-06-10 NOTE — Progress Notes (Signed)
Pre visit completed via phone call; Patient verified name, DOB, and address; No egg or soy allergy known to patient  No issues known to pt with past sedation with any surgeries or procedures Patient denies ever being told they had issues or difficulty with intubation  No FH of Malignant Hyperthermia Pt is not on diet pills Pt is not on  home 02  Pt is not on blood thinners  Pt denies issues with constipation  No A fib or A flutter  COVID 19 guidelines implemented in [place] today with Pt and RN  Pt is fully vaccinated for Covid x 2 + booster; Medicare Coupon given to pt in [place] today and NO PA's for preps discussed with pt in [place] today  Discussed with pt there will be an out-of-pocket cost for prep and that varies from $0 to 70 +  dollars  Due to the COVID-19 pandemic we are asking patients to follow certain guidelines.  Pt aware of COVID protocols and [facility] guidelines

## 2021-06-26 ENCOUNTER — Encounter: Payer: Self-pay | Admitting: Internal Medicine

## 2021-06-26 ENCOUNTER — Ambulatory Visit (INDEPENDENT_AMBULATORY_CARE_PROVIDER_SITE_OTHER): Payer: Medicare Other | Admitting: Internal Medicine

## 2021-06-26 ENCOUNTER — Other Ambulatory Visit: Payer: Self-pay

## 2021-06-26 VITALS — BP 136/82 | HR 56 | Temp 98.2°F | Resp 16 | Ht 70.0 in | Wt 155.0 lb

## 2021-06-26 DIAGNOSIS — I10 Essential (primary) hypertension: Secondary | ICD-10-CM | POA: Diagnosis not present

## 2021-06-26 DIAGNOSIS — Z23 Encounter for immunization: Secondary | ICD-10-CM | POA: Diagnosis not present

## 2021-06-26 DIAGNOSIS — I2510 Atherosclerotic heart disease of native coronary artery without angina pectoris: Secondary | ICD-10-CM

## 2021-06-26 DIAGNOSIS — E785 Hyperlipidemia, unspecified: Secondary | ICD-10-CM

## 2021-06-26 DIAGNOSIS — I2583 Coronary atherosclerosis due to lipid rich plaque: Secondary | ICD-10-CM | POA: Diagnosis not present

## 2021-06-26 LAB — BASIC METABOLIC PANEL
BUN: 17 mg/dL (ref 6–23)
CO2: 30 mEq/L (ref 19–32)
Calcium: 9.7 mg/dL (ref 8.4–10.5)
Chloride: 104 mEq/L (ref 96–112)
Creatinine, Ser: 1.17 mg/dL (ref 0.40–1.50)
GFR: 65.28 mL/min (ref >60.00)
Glucose, Bld: 94 mg/dL (ref 70–99)
Potassium: 4.3 mEq/L (ref 3.5–5.1)
Sodium: 139 mEq/L (ref 135–145)

## 2021-06-26 LAB — CBC WITH DIFFERENTIAL/PLATELET
Basophils Absolute: 0 10*3/uL (ref 0.0–0.1)
Basophils Relative: 0.5 % (ref 0.0–3.0)
Eosinophils Absolute: 0.2 10*3/uL (ref 0.0–0.7)
Eosinophils Relative: 2.7 % (ref 0.0–5.0)
HCT: 42.4 % (ref 39.0–52.0)
Hemoglobin: 14.5 g/dL (ref 13.0–17.0)
Lymphocytes Relative: 13.5 % (ref 12.0–46.0)
Lymphs Abs: 1 10*3/uL (ref 0.7–4.0)
MCHC: 34.1 g/dL (ref 30.0–36.0)
MCV: 92.5 fl (ref 78.0–100.0)
Monocytes Absolute: 0.6 10*3/uL (ref 0.1–1.0)
Monocytes Relative: 7.3 % (ref 3.0–12.0)
Neutro Abs: 5.9 10*3/uL (ref 1.4–7.7)
Neutrophils Relative %: 76 % (ref 43.0–77.0)
Platelets: 235 10*3/uL (ref 150.0–400.0)
RBC: 4.59 Mil/uL (ref 4.22–5.81)
RDW: 13.8 % (ref 11.5–15.5)
WBC: 7.8 10*3/uL (ref 4.0–10.5)

## 2021-06-26 NOTE — Progress Notes (Signed)
Subjective:    Patient ID: Seth Baker, male    DOB: 12-31-1955, 65 y.o.   MRN: [id number]  DOS:  06/26/2021 Type of visit - description: f/u  Since the last office visit he is doing well. He saw cardiology, note reviewed. No major concerns except that he has occasional pain at the left groin, hernia?.  Denies any lump in that area.  No scrotal swelling or pain.  Review of Systems See above   Past Medical History:  Diagnosis Date   Annual physical exam 06/09/2011   HTN (hypertension) 01/13/2007   Qualifier: Diagnosis of  By: Cletus Gash MD, [place]     Hyperlipidemia    on meds   HYPERLIPIDEMIA 04/22/2007   Annotation: Previously declined therapy Qualifier: Diagnosis of  By: Cletus Gash MD, [place]     Hypertension    on meds   SHOULDER PAIN, LEFT 01/31/2008   Centricity Description: SHOULDER PAIN, LEFT Qualifier: Diagnosis of  By: Larose Kells MD, [place]  Centricity Description: SHOULDER PAIN, RIGHT Qualifier: Diagnosis of  By: Larose Kells MD, [place] LESION 11/29/2009   Qualifier: Diagnosis of  By: Larose Kells MD, [place]    Tubular adenoma of colon 09/26/2014    Past Surgical History:  Procedure Laterality Date   LAPAROSCOPIC INGUINAL HERNIA REPAIR Right    LAPAROSCOPIC INGUINAL HERNIA REPAIR Left    NASAL SINUS SURGERY     in the 1980's in [place]   Perianal lesion removal  2015   Dr. Johney Maine   ROTATOR CUFF REPAIR Left 2018    Allergies as of 06/26/2021       Reactions   Atorvastatin    Not sure of reaction   Niacin    Redness & itching        Medication List        Accurate as of June 26, 2021 11:59 PM. If you have any questions, ask your nurse or doctor.          amLODipine 5 MG tablet Commonly known as: NORVASC Take 1 tablet (5 mg total) by mouth daily.   aspirin 81 MG tablet Take 81 mg by mouth daily.   atenolol 50 MG tablet Commonly known as: TENORMIN Take 1/2 tablet in the morning and 1/2 tablet in the evening daily. What changed:  how much to  take how to take this when to take this   ezetimibe 10 MG tablet Commonly known as: ZETIA Take 1 tablet (10 mg total) by mouth daily.   FLAXSEED OIL PO Take 1 tablet by mouth 3 (three) times daily. Unknown strength   fluticasone 50 MCG/ACT nasal spray Commonly known as: FLONASE Place 1 spray into both nostrils 2 (two) times daily.   Plenvu 140 g Solr Generic drug: PEG-KCl-NaCl-NaSulf-Na Asc-C Take 1 kit by mouth as directed.   ramipril 10 MG capsule Commonly known as: Altace Take 1 capsule (10 mg total) by mouth daily.   rosuvastatin 10 MG tablet Commonly known as: Crestor Take 1 tablet (10 mg total) by mouth daily.           Objective:   Physical Exam BP 136/82 (BP Location: Left Arm, Patient Position: Sitting, Cuff Size: Small)   Pulse (!) 56   Temp 98.2 F (36.8 C) (Oral)   Resp 16   Ht _0  (1.778 m)   Wt 155 lb (70.3 kg)   SpO2 98%   BMI 22.24 kg/m  General:   Well developed, NAD, BMI noted.  HEENT:  Normocephalic . Face symmetric, atraumatic Lungs:  CTA B Normal respiratory effort, no intercostal retractions, no accessory muscle use. Heart: RRR,  no murmur.  Abdomen:  Not distended, soft, non-tender. No rebound or rigidity. Groins: No mass with Valsalva, no obvious hernia, no lymphadenopathies. GU: Normal scrotal contents. Skin: Not pale. Not jaundice Lower extremities: no pretibial edema bilaterally  Neurologic:  alert & oriented X3.  Speech normal, gait appropriate for age and unassisted Psych--  Cognition and judgment appear intact.  Cooperative with normal attention span and concentration.  Behavior appropriate. No anxious or depressed appearing.     Assessment      Assessment  HTN Hyperlipidemia 06-2014 carotid US  (1-39%), October 2019 and December 2021: 1-39% stable.  Coronary calcium score: Elevated, saw cardiology 03-2021.  Plan: CV RF control  PLAN: HTN: Ambulatory BPs in the 120/80, occasionally 140.  Continue amlodipine,  Tenormin, Altace.  Check a BMP, CBC. Hyperlipidemia: Since the last visit, his coronary calcium score was very high, was switch from Pravachol to Crestor and continue Zetia. Eventually saw cardiology 04/17/2021, they discussed possibly stress test. FLP was checked with excellent results. Left inguinal hernia?  No evidence on physical exam.  Observation Preventive care: Flu shot today PNM 20 today Recommend a COVID booster CCS: To see GI soon RTC 6 months   This visit occurred during the SARS-CoV-2 public health emergency.  Safety protocols were in place, including screening questions prior to the visit, additional usage of staff PPE, and extensive cleaning of exam room while observing appropriate contact time as indicated for disinfecting solutions.

## 2021-06-26 NOTE — Patient Instructions (Signed)
Check the  blood pressure regularly  BP GOAL is between 110/65 and  135/85. If it is consistently higher or lower, let me know   Proceed with your COVID booster at your convenience GO TO THE LAB : Get the blood work     [place], [place] back for a checkup in 6 months

## 2021-06-27 NOTE — Assessment & Plan Note (Signed)
HTN: Ambulatory BPs in the 120/80, occasionally 140.  Continue amlodipine, Tenormin, Altace.  Check a BMP, CBC. Hyperlipidemia: Since the last visit, his coronary calcium score was very high, was switch from Pravachol to Crestor and continue Zetia. Eventually saw cardiology 04/17/2021, they discussed possibly stress test. FLP was checked with excellent results. Left inguinal hernia?  No evidence on physical exam.  Observation Preventive care: Flu shot today PNM 20 today Recommend a COVID booster CCS: To see GI soon RTC 6 months

## 2021-07-13 ENCOUNTER — Encounter: Payer: Self-pay | Admitting: Certified Registered Nurse Anesthetist

## 2021-07-14 ENCOUNTER — Ambulatory Visit (AMBULATORY_SURGERY_CENTER): Payer: Medicare Other | Admitting: Internal Medicine

## 2021-07-14 ENCOUNTER — Encounter: Payer: Self-pay | Admitting: Internal Medicine

## 2021-07-14 ENCOUNTER — Other Ambulatory Visit: Payer: Self-pay

## 2021-07-14 VITALS — BP 97/50 | HR 58 | Temp 96.4°F | Resp 12 | Ht 70.0 in | Wt 160.0 lb

## 2021-07-14 DIAGNOSIS — D120 Benign neoplasm of cecum: Secondary | ICD-10-CM | POA: Diagnosis not present

## 2021-07-14 DIAGNOSIS — D128 Benign neoplasm of rectum: Secondary | ICD-10-CM

## 2021-07-14 DIAGNOSIS — Z86010 Personal history of colonic polyps: Secondary | ICD-10-CM

## 2021-07-14 DIAGNOSIS — K635 Polyp of colon: Secondary | ICD-10-CM | POA: Diagnosis not present

## 2021-07-14 MED ORDER — SODIUM CHLORIDE 0.9 % IV SOLN
500.0000 mL | Freq: Once | INTRAVENOUS | Status: DC
Start: 2021-07-14 — End: 2021-07-14

## 2021-07-14 NOTE — Progress Notes (Signed)
Pt's states no medical or surgical changes since previsit or office visit.  Vitals DT 

## 2021-07-14 NOTE — Progress Notes (Signed)
Report given to PACU, vss 

## 2021-07-14 NOTE — Op Note (Signed)
[facility] Patient Name: Seth Baker Procedure Date: 07/14/2021 7:49 AM MRN: [id number] Endoscopist: Jerene Bears , MD Age: 65 Referring MD:  Date of Birth: 10-Aug-1956 Gender: Male Account #: [id number] Procedure:                Colonoscopy Indications:              High risk colon cancer surveillance: Personal                            history of non-advanced adenoma, Last colonoscopy:                            December 2015 Medicines:                Monitored Anesthesia Care Procedure:                Pre-Anesthesia Assessment:                           - Prior to the procedure, a History and Physical                            was performed, and patient medications and                            allergies were reviewed. The patient's tolerance of                            previous anesthesia was also reviewed. The risks                            and benefits of the procedure and the sedation                            options and risks were discussed with the patient.                            All questions were answered, and informed consent                            was obtained. Prior Anticoagulants: The patient has                            taken no previous anticoagulant or antiplatelet                            agents. ASA Grade Assessment: II - A patient with                            mild systemic disease. After reviewing the risks                            and benefits, the patient was deemed in  satisfactory condition to undergo the procedure.                           After obtaining informed consent, the colonoscope                            was passed under direct vision. Throughout the                            procedure, the patient's blood pressure, pulse, and                            oxygen saturations were monitored continuously. The                            CF [id number] #[id number] was introduced through the anus                             and advanced to the cecum, identified by                            appendiceal orifice and ileocecal valve. The                            colonoscopy was performed without difficulty. The                            patient tolerated the procedure well. The quality                            of the bowel preparation was excellent. The                            ileocecal valve, appendiceal orifice, and rectum                            were photographed. Scope In: 8:03:20 AM Scope Out: 8:19:23 AM Scope Withdrawal Time: 0 hours 11 minutes 39 seconds  Total Procedure Duration: 0 hours 16 minutes 3 seconds  Findings:                 The digital rectal exam was normal.                           A 5 mm polyp was found in the cecum. The polyp was                            sessile. The polyp was removed with a cold snare.                            Resection and retrieval were complete.                           A 6 mm polyp was found in the rectum. The polyp was  sessile. The polyp was removed with a cold snare.                            Resection and retrieval were complete.                           Multiple small-mouthed diverticula were found in                            the sigmoid colon and ascending colon.                           The retroflexed view of the distal rectum and anal                            verge was normal and showed no anal or rectal                            abnormalities. Complications:            No immediate complications. Estimated Blood Loss:     Estimated blood loss was minimal. Impression:               - One 5 mm polyp in the cecum, removed with a cold                            snare. Resected and retrieved.                           - One 6 mm polyp in the rectum, removed with a cold                            snare. Resected and retrieved.                           - Diverticulosis in the sigmoid  colon and in the                            ascending colon.                           - The distal rectum and anal verge are normal on                            retroflexion view. Recommendation:           - Patient has a contact number available for                            emergencies. The signs and symptoms of potential                            delayed complications were discussed with the  patient. Return to normal activities tomorrow.                            Written discharge instructions were provided to the                            patient.                           - Resume previous diet.                           - Continue present medications.                           - Await pathology results.                           - Repeat colonoscopy is recommended for                            surveillance. The colonoscopy date will be                            determined after pathology results from today's                            exam become available for review. Jerene Bears, MD 07/14/2021 8:26:06 AM This report has been signed electronically.

## 2021-07-14 NOTE — Patient Instructions (Signed)
YOU HAD AN ENDOSCOPIC PROCEDURE TODAY AT [facility]:   Refer to the procedure report that was given to you for any specific questions about what was found during the examination.  If the procedure report does not answer your questions, please call your gastroenterologist to clarify.  If you requested that your care partner not be given the details of your procedure findings, then the procedure report has been included in a sealed envelope for you to review at your convenience later.  **Handouts Polyps and Diverticulosis**  YOU SHOULD EXPECT: Some feelings of bloating in the abdomen. Passage of more gas than usual.  Walking can help get rid of the air that was put into your GI tract during the procedure and reduce the bloating. If you had a lower endoscopy (such as a colonoscopy or flexible sigmoidoscopy) you may notice spotting of blood in your stool or on the toilet paper. If you underwent a bowel prep for your procedure, you may not have a normal bowel movement for a few days.  Please Note:  You might notice some irritation and congestion in your nose or some drainage.  This is from the oxygen used during your procedure.  There is no need for concern and it should clear up in a day or so.  SYMPTOMS TO REPORT IMMEDIATELY:  Following lower endoscopy (colonoscopy or flexible sigmoidoscopy):  Excessive amounts of blood in the stool  Significant tenderness or worsening of abdominal pains  Swelling of the abdomen that is new, acute  Fever of 100F or higher  For urgent or emergent issues, a gastroenterologist can be reached at any hour by calling [phone]. Do not use MyChart messaging for urgent concerns.    DIET:  We do recommend a small meal at first, but then you may proceed to your regular diet.  Drink plenty of fluids but you should avoid alcoholic beverages for 24 hours.  ACTIVITY:  You should plan to take it easy for the rest of today and you should NOT DRIVE or use  heavy machinery until tomorrow (because of the sedation medicines used during the test).    FOLLOW UP: Our staff will call the number listed on your records 48-72 hours following your procedure to check on you and address any questions or concerns that you may have regarding the information given to you following your procedure. If we do not reach you, we will leave a message.  We will attempt to reach you two times.  During this call, we will ask if you have developed any symptoms of COVID 19. If you develop any symptoms (ie: fever, flu-like symptoms, shortness of breath, cough etc.) before then, please call [phone].  If you test positive for Covid 19 in the 2 weeks post procedure, please call and report this information to [place].    If any biopsies were taken you will be contacted by phone or by letter within the next 1-3 weeks.  Please call us at [phone] if you have not heard about the biopsies in 3 weeks.    SIGNATURES/CONFIDENTIALITY: You and/or your care partner have signed paperwork which will be entered into your electronic medical record.  These signatures attest to the fact that that the information above on your After Visit Summary has been reviewed and is understood.  Full responsibility of the confidentiality of this discharge information lies with you and/or your care-partner.

## 2021-07-14 NOTE — Progress Notes (Signed)
Called to room to assist during endoscopic procedure.  Patient ID and intended procedure confirmed with present staff. Received instructions for my participation in the procedure from the performing physician.  

## 2021-07-14 NOTE — Progress Notes (Signed)
GASTROENTEROLOGY PROCEDURE H&P NOTE   Primary Care Physician: Colon Branch, MD    Reason for Procedure:  History of colon polyps  Plan:    Colonoscopy  Patient is appropriate for endoscopic procedure(s) in the ambulatory ([place]) setting.  The nature of the procedure, as well as the risks, benefits, and alternatives were carefully and thoroughly reviewed with the patient. Ample time for discussion and questions allowed. The patient understood, was satisfied, and agreed to proceed.     HPI: Seth Baker is a 65 y.o. male who presents for surveillance colonoscopy due to history of adenomatous colon polyp removed in December 2015.  Tolerated the prep.  Medical history as below.  No complaints today including recent chest pain or shortness of breath.  Past Medical History:  Diagnosis Date   Annual physical exam 06/09/2011   HTN (hypertension) 01/13/2007   Qualifier: Diagnosis of  By: Cletus Gash MD, [place]     Hyperlipidemia    on meds   HYPERLIPIDEMIA 04/22/2007   Annotation: Previously declined therapy Qualifier: Diagnosis of  By: Cletus Gash MD, [place]     Hypertension    on meds   SHOULDER PAIN, LEFT 01/31/2008   Centricity Description: SHOULDER PAIN, LEFT Qualifier: Diagnosis of  By: Larose Kells MD, [place]  Centricity Description: SHOULDER PAIN, RIGHT Qualifier: Diagnosis of  By: Larose Kells MD, [place] LESION 11/29/2009   Qualifier: Diagnosis of  By: Larose Kells MD, [place]    Tubular adenoma of colon 09/26/2014    Past Surgical History:  Procedure Laterality Date   COLONOSCOPY  08/2014   JMP- 5 yr recall   LAPAROSCOPIC INGUINAL HERNIA REPAIR Right    LAPAROSCOPIC INGUINAL HERNIA REPAIR Left    NASAL SINUS SURGERY     in the 1980's in [place]   Perianal lesion removal  2015   Dr. Johney Maine   ROTATOR CUFF REPAIR Left 2018    Prior to Admission medications   Medication Sig Start Date End Date Taking? Authorizing Provider  amLODipine (NORVASC) 5 MG tablet Take 1 tablet (5 mg total) by  mouth daily. 02/12/21  Yes Colon Branch, MD  aspirin 81 MG tablet Take 81 mg by mouth daily.   Yes [provider]  atenolol (TENORMIN) 50 MG tablet Take 1/2 tablet in the morning and 1/2 tablet in the evening daily. Patient taking differently: Take 25 mg by mouth 2 (two) times daily. Take 1/2 tablet in the morning and 1/2 tablet in the evening daily. 01/16/21  Yes Park Liter, MD  ezetimibe (ZETIA) 10 MG tablet Take 1 tablet (10 mg total) by mouth daily. 02/12/21  Yes Paz, Jose E, MD  Flaxseed, Linseed, (FLAXSEED OIL PO) Take 1 tablet by mouth 3 (three) times daily. Unknown strength   Yes [provider]  ramipril (ALTACE) 10 MG capsule Take 1 capsule (10 mg total) by mouth daily. 02/12/21  Yes Paz, Alda Berthold, MD  rosuvastatin (CRESTOR) 10 MG tablet Take 1 tablet (10 mg total) by mouth daily. 02/12/21  Yes Paz, Alda Berthold, MD  fluticasone [facility]) 50 MCG/ACT nasal spray Place 1 spray into both nostrils 2 (two) times daily. 06/03/21   [provider]    Current Outpatient Medications  Medication Sig Dispense Refill   amLODipine (NORVASC) 5 MG tablet Take 1 tablet (5 mg total) by mouth daily. 90 tablet 1   aspirin 81 MG tablet Take 81 mg by mouth daily.     atenolol (TENORMIN) 50 MG tablet  Take 1/2 tablet in the morning and 1/2 tablet in the evening daily. (Patient taking differently: Take 25 mg by mouth 2 (two) times daily. Take 1/2 tablet in the morning and 1/2 tablet in the evening daily.) 90 tablet 1   ezetimibe (ZETIA) 10 MG tablet Take 1 tablet (10 mg total) by mouth daily. 90 tablet 1   Flaxseed, Linseed, (FLAXSEED OIL PO) Take 1 tablet by mouth 3 (three) times daily. Unknown strength     ramipril (ALTACE) 10 MG capsule Take 1 capsule (10 mg total) by mouth daily. 90 capsule 1   rosuvastatin (CRESTOR) 10 MG tablet Take 1 tablet (10 mg total) by mouth daily. 90 tablet 1   fluticasone (FLONASE) 50 MCG/ACT nasal spray Place 1 spray into both nostrils 2 (two) times daily.      Current Facility-Administered Medications  Medication Dose Route Frequency Provider Last Rate Last Admin   0.9 %  sodium chloride infusion  500 mL Intravenous Once Furkan Keenum, Lajuan Lines, MD        Allergies as of 07/14/2021 - Review Complete 07/14/2021  Allergen Reaction Noted   Atorvastatin  05/24/2007   Niacin  05/24/2007    Family History  Problem Relation Age of Onset   Stroke Mother 6       lives in [place]    CAD Neg Hx    Diabetes Neg Hx    Colon cancer Neg Hx    Prostate cancer Neg Hx    Colon polyps Neg Hx    Rectal cancer Neg Hx    Stomach cancer Neg Hx     Social History   Socioeconomic History   Marital status: Divorced    Spouse name: Not on file   Number of children: 3   Years of education: Not on file   Highest education level: Not on file  Occupational History   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  Tobacco Use   Smoking status: Never   Smokeless tobacco: Never  Vaping Use   Vaping Use: Never used  Substance and Sexual Activity   Alcohol use: Yes    Alcohol/week: 3.0 - 4.0 standard drinks    Types: 3 - 4 Standard drinks or equivalent per week    Comment: socially    Drug use: No   Sexual activity: Not on file  Other Topics Concern   Not on file  Social History Narrative   divorced 2020, lives w/ older son   Has  3 children ,   Pt is  from [place]       Social Determinants of Health   Financial Resource Strain: Not on file  Food Insecurity: Not on file  Transportation Needs: Not on file  Physical Activity: Not on file  Stress: Not on file  Social Connections: Not on file  Intimate Partner Violence: Not on file    Physical Exam: Vital signs in last 24 hours: @BP  137/74   Pulse 66   Temp (!) 96.4 F (35.8 C) (Temporal)   Ht 5\' 10"  (1.778 m)   Wt 160 lb (72.6 kg)   SpO2 99%   BMI 22.96 kg/m  GEN: NAD EYE: Sclerae anicteric ENT: MMM CV: Non-tachycardic Pulm: CTA b/l GI: Soft, NT/ND NEURO:  Alert & Oriented x  3   [name], MD [facility]  07/14/2021 7:58 AM

## 2021-07-16 ENCOUNTER — Telehealth: Payer: Self-pay

## 2021-07-16 NOTE — Telephone Encounter (Signed)
  Follow up Call-  Call back number 07/14/2021  Post procedure Call Back phone  # [phone]  Permission to leave phone message Yes  Some recent data might be hidden     Patient questions:  Do you have a fever, pain , or abdominal swelling? No. Pain Score  0 *  Have you tolerated food without any problems? Yes.    Have you been able to return to your normal activities? Yes.    Do you have any questions about your discharge instructions: Diet   No. Medications  No. Follow up visit  No.  Do you have questions or concerns about your Care? No.  Actions: * If pain score is 4 or above: No action needed, pain <4.  Have you developed a fever since your procedure? no  2.   Have you had an respiratory symptoms (SOB or cough) since your procedure? no  3.   Have you tested positive for COVID 19 since your procedure no  4.   Have you had any family members/close contacts diagnosed with the COVID 19 since your procedure?  no   If yes to any of these questions please route to Joylene John, RN and Joella Prince, RN

## 2021-07-16 NOTE — Telephone Encounter (Signed)
NO ANSWER, MESSAGE LEFT FOR PATIENT. 

## 2021-07-22 ENCOUNTER — Encounter: Payer: Self-pay | Admitting: Internal Medicine

## 2021-08-09 ENCOUNTER — Other Ambulatory Visit: Payer: Self-pay | Admitting: Cardiology

## 2021-08-12 ENCOUNTER — Other Ambulatory Visit: Payer: Self-pay | Admitting: Internal Medicine

## 2021-09-05 ENCOUNTER — Other Ambulatory Visit: Payer: Self-pay | Admitting: Internal Medicine

## 2021-09-29 ENCOUNTER — Other Ambulatory Visit: Payer: Self-pay | Admitting: Internal Medicine

## 2021-10-23 ENCOUNTER — Encounter: Payer: Self-pay | Admitting: Cardiology

## 2021-10-23 ENCOUNTER — Other Ambulatory Visit: Payer: Self-pay

## 2021-10-23 ENCOUNTER — Ambulatory Visit (INDEPENDENT_AMBULATORY_CARE_PROVIDER_SITE_OTHER): Payer: Medicare Other | Admitting: Cardiology

## 2021-10-23 VITALS — BP 130/84 | HR 50 | Ht 70.0 in | Wt 154.0 lb

## 2021-10-23 DIAGNOSIS — E782 Mixed hyperlipidemia: Secondary | ICD-10-CM

## 2021-10-23 DIAGNOSIS — I2510 Atherosclerotic heart disease of native coronary artery without angina pectoris: Secondary | ICD-10-CM

## 2021-10-23 DIAGNOSIS — I10 Essential (primary) hypertension: Secondary | ICD-10-CM | POA: Diagnosis not present

## 2021-10-23 NOTE — Progress Notes (Signed)
Cardiology Office Note:    Date:  10/23/2021   ID:  Seth, Baker December 28, 1955, MRN [id number]  PCP:  Colon Branch, MD  Cardiologist:  Jenne Campus, MD    Referring MD: Colon Branch, MD   Chief Complaint  Patient presents with   Follow-up  I am doing fine  History of Present Illness:    Seth Baker is a 66 y.o. male with past medical history significant for coronary artery disease in form of elevated calcium score 540, however he is completely asymptomatic, dyslipidemia, essential hypertension.  Comes today to my office for follow-up.  Overall he is doing well.  He is asymptomatic.  He denies have any chest pain tightness squeezing pressure burning chest no palpitations dizziness of swelling of lower extremities.  We had a long discussion about healthy lifestyle that he is already compliant with.  He also his diet he eliminated a lot of carbohydrates he is active for walking with no difficulties  Past Medical History:  Diagnosis Date   Annual physical exam 06/09/2011   HTN (hypertension) 01/13/2007   Qualifier: Diagnosis of  By: Cletus Gash MD, [place]     Hyperlipidemia    on meds   HYPERLIPIDEMIA 04/22/2007   Annotation: Previously declined therapy Qualifier: Diagnosis of  By: Cletus Gash MD, [place]     Hypertension    on meds   SHOULDER PAIN, LEFT 01/31/2008   Centricity Description: SHOULDER PAIN, LEFT Qualifier: Diagnosis of  By: Larose Kells MD, [place]  Centricity Description: SHOULDER PAIN, RIGHT Qualifier: Diagnosis of  By: Larose Kells MD, [place] LESION 11/29/2009   Qualifier: Diagnosis of  By: Larose Kells MD, [place]    Tubular adenoma of colon 09/26/2014    Past Surgical History:  Procedure Laterality Date   COLONOSCOPY  08/2014   JMP- 5 yr recall   LAPAROSCOPIC INGUINAL HERNIA REPAIR Right    LAPAROSCOPIC INGUINAL HERNIA REPAIR Left    NASAL SINUS SURGERY     in the 1980's in [place]   Perianal lesion removal  2015   Dr. Johney Maine   ROTATOR CUFF REPAIR Left 2018     Current Medications: Current Meds  Medication Sig   amLODipine (NORVASC) 5 MG tablet Take 1 tablet (5 mg total) by mouth daily.   aspirin 81 MG tablet Take 81 mg by mouth daily.   atenolol (TENORMIN) 50 MG tablet TAKE 1/2 (ONE-HALF) TABLET BY MOUTH IN THE MORNING AND 1/2 (ONE-HALF) TABLET IN THE EVENING   ezetimibe (ZETIA) 10 MG tablet Take 1 tablet by mouth once daily   Flaxseed, Linseed, (FLAXSEED OIL PO) Take 1 tablet by mouth 3 (three) times daily. Unknown strength   fluticasone (FLONASE) 50 MCG/ACT nasal spray Place 1 spray into both nostrils 2 (two) times daily.   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  Tobacco Use   Smoking status: Never   Smokeless tobacco: Never  Vaping Use   Vaping Use: Never used  Substance and Sexual Activity   Alcohol use:  Yes    Alcohol/week: 3.0 - 4.0 standard drinks    Types: 3 - 4 Standard drinks or equivalent per week    Comment: socially    Drug use: No   Sexual activity: Not on file  Other Topics Concern   Not on file  Social History Narrative   divorced 2020, lives w/ older son   Has  3 children ,   Pt is  from [place]       Social Determinants of Radio broadcast assistant Strain: Not on file  Food Insecurity: Not on file  Transportation Needs: Not on file  Physical Activity: Not on file  Stress: Not on file  Social Connections: Not on file     Family History: The patient's family history includes Stroke (age of onset: 28) in his mother. There is no history of CAD, Diabetes, Colon cancer, Prostate cancer, Colon polyps,  Rectal cancer, or Stomach cancer. ROS:   Please see the history of present illness.    All 14 point review of systems negative except as described per history of present illness  EKGs/Labs/Other Studies Reviewed:      Recent Labs: 04/17/2021: ALT 29 06/26/2021: BUN 17; Creatinine, Ser 1.17; Hemoglobin 14.5; Platelets 235.0; Potassium 4.3; Sodium 139  Recent Lipid Panel    Component Value Date/Time   CHOL 100 04/17/2021 0915   TRIG 78 04/17/2021 0915   TRIG 113 09/06/2006 0828   HDL 41 04/17/2021 0915   CHOLHDL 2.4 04/17/2021 0915   CHOLHDL 4 08/26/2020 0942   VLDL 29.4 08/26/2020 0942   LDLCALC 43 04/17/2021 0915   LDLCALC 140 (H) 07/09/2020 0945   LDLDIRECT 143.4 04/18/2007 0818    Physical Exam:    VS:  BP 130/84 (BP Location: Left Arm, Patient Position: Sitting, Cuff Size: Normal)    Pulse (!) 50    Ht 5\' 10"  (1.778 m)    Wt 154 lb (69.9 kg)    SpO2 99%    BMI 22.10 kg/m     Wt Readings from Last 3 Encounters:  10/23/21 154 lb (69.9 kg)  07/14/21 160 lb (72.6 kg)  06/26/21 155 lb (70.3 kg)     GEN:  Well nourished, well developed in no acute distress HEENT: Normal NECK: No JVD; No carotid bruits LYMPHATICS: No lymphadenopathy CARDIAC: RRR, no murmurs, no rubs, no gallops RESPIRATORY:  Clear to auscultation without rales, wheezing or rhonchi  ABDOMEN: Soft, non-tender, non-distended MUSCULOSKELETAL:  No edema; No deformity  SKIN: Warm and dry LOWER EXTREMITIES: no swelling NEUROLOGIC:  Alert and oriented x 3 PSYCHIATRIC:  Normal affect   ASSESSMENT:    1. Coronary artery disease involving native coronary artery of native heart without angina pectoris   2. Primary hypertension   3. Mixed hyperlipidemia    PLAN:    In order of problems listed above:  Elevated calcium score but gentleman is completely asymptomatic.  He does not want to have any stress test we will continue present management.  He is on aspirin and statin we will continue Essential  hypertension blood pressure well controlled continue present management. Dyslipidemia I did review his K PN which show me his LDL 43 HDL 41.  I will recheck his fasting profile in few months.   Medication Adjustments/Labs and Tests Ordered: Current medicines are reviewed at length with the patient today.  Concerns regarding medicines are outlined above.  No orders of the defined types were placed in this encounter.  Medication changes: No orders of the  defined types were placed in this encounter.   Signed, Park Liter, MD, [facility] 10/23/2021 9:43 AM    [place]

## 2021-10-23 NOTE — Addendum Note (Signed)
Addended by: Edwyna Shell I on: 10/23/2021 10:04 AM   Modules accepted: Orders

## 2021-10-23 NOTE — Patient Instructions (Signed)
Medication Instructions:  Your physician recommends that you continue on your current medications as directed. Please refer to the Current Medication list given to you today.  *If you need a refill on your cardiac medications before your next appointment, please call your pharmacy*   Lab Work: Your physician recommends that you return for lab work in: Labs in 1 month: Lipids If you have labs (blood work) drawn today and your tests are completely normal, you will receive your results only by: [place] (if you have [place]) OR A paper copy in the mail If you have any lab test that is abnormal or we need to change your treatment, we will call you to review the results.   Testing/Procedures: None   Follow-Up: At [facility], you and your health needs are our priority.  As part of our continuing mission to provide you with exceptional heart care, we have created designated Provider Care Teams.  These Care Teams include your primary Cardiologist (physician) and Advanced Practice Providers (APPs -  Physician Assistants and Nurse Practitioners) who all work together to provide you with the care you need, when you need it.  We recommend signing up for the patient portal called "MyChart".  Sign up information is provided on this After Visit Summary.  MyChart is used to connect with patients for Virtual Visits (Telemedicine).  Patients are able to view lab/test results, encounter notes, upcoming appointments, etc.  Non-urgent messages can be sent to your provider as well.   To learn more about what you can do with MyChart, go to [email or web address].    Your next appointment:   6 month(s)  The format for your next appointment:   In Person  Provider:   Jenne Campus, MD    Other Instructions None

## 2021-11-01 ENCOUNTER — Other Ambulatory Visit: Payer: Self-pay | Admitting: Cardiology

## 2021-11-07 ENCOUNTER — Telehealth: Payer: Self-pay | Admitting: Internal Medicine

## 2021-11-07 NOTE — Telephone Encounter (Signed)
Left message for patient to call back and schedule Medicare Annual Wellness Visit (AWV) in office.  ° °If not able to come in office, please offer to do virtually or by telephone.  Left office number and my jabber #[phone]. ° °Due for AWVI ° °Please schedule at anytime with Nurse Health Advisor. °  °

## 2021-11-11 ENCOUNTER — Other Ambulatory Visit: Payer: Self-pay | Admitting: Internal Medicine

## 2021-11-13 ENCOUNTER — Ambulatory Visit: Payer: Medicare Other

## 2021-12-03 LAB — LIPID PANEL
Chol/HDL Ratio: 2.4 ratio (ref 0.0–5.0)
Cholesterol, Total: 109 mg/dL (ref 100–199)
HDL: 46 mg/dL (ref >39)
LDL Chol Calc (NIH): 46 mg/dL (ref 0–99)
Triglycerides: 83 mg/dL (ref 0–149)
VLDL Cholesterol Cal: 17 mg/dL (ref 5–40)

## 2021-12-26 ENCOUNTER — Ambulatory Visit: Payer: Medicare Other | Admitting: Internal Medicine

## 2022-01-05 ENCOUNTER — Ambulatory Visit: Payer: Medicare Other | Admitting: Internal Medicine

## 2022-01-12 ENCOUNTER — Ambulatory Visit (INDEPENDENT_AMBULATORY_CARE_PROVIDER_SITE_OTHER): Payer: Medicare Other | Admitting: Internal Medicine

## 2022-01-12 ENCOUNTER — Encounter: Payer: Self-pay | Admitting: Internal Medicine

## 2022-01-12 VITALS — BP 124/63 | HR 52 | Temp 97.9°F | Resp 12 | Ht 70.0 in | Wt 153.2 lb

## 2022-01-12 DIAGNOSIS — E785 Hyperlipidemia, unspecified: Secondary | ICD-10-CM | POA: Diagnosis not present

## 2022-01-12 DIAGNOSIS — Z125 Encounter for screening for malignant neoplasm of prostate: Secondary | ICD-10-CM | POA: Diagnosis not present

## 2022-01-12 DIAGNOSIS — I10 Essential (primary) hypertension: Secondary | ICD-10-CM

## 2022-01-12 DIAGNOSIS — I2510 Atherosclerotic heart disease of native coronary artery without angina pectoris: Secondary | ICD-10-CM | POA: Diagnosis not present

## 2022-01-12 LAB — PSA: PSA: 3.44 ng/mL (ref 0.10–4.00)

## 2022-01-12 LAB — COMPREHENSIVE METABOLIC PANEL
ALT: 21 U/L (ref 0–53)
AST: 21 U/L (ref 0–37)
Albumin: 4.3 g/dL (ref 3.5–5.2)
Alkaline Phosphatase: 60 U/L (ref 39–117)
BUN: 17 mg/dL (ref 6–23)
CO2: 30 mEq/L (ref 19–32)
Calcium: 9.7 mg/dL (ref 8.4–10.5)
Chloride: 104 mEq/L (ref 96–112)
Creatinine, Ser: 1.04 mg/dL (ref 0.40–1.50)
GFR: 74.9 mL/min (ref >60.00)
Glucose, Bld: 102 mg/dL — ABNORMAL HIGH (ref 70–99)
Potassium: 4.7 mEq/L (ref 3.5–5.1)
Sodium: 140 mEq/L (ref 135–145)
Total Bilirubin: 1.4 mg/dL — ABNORMAL HIGH (ref 0.2–1.2)
Total Protein: 6.9 g/dL (ref 6.0–8.3)

## 2022-01-12 NOTE — Assessment & Plan Note (Signed)
HTN: BP is very good, doing well with diet.  Continue Tenormin, Altace.  Checking a CMP ?Hyperlipidemia: On Zetia and Crestor, well controlled ?Calcium coronary score slightly elevated, plan is to control CV risk factors. ?Preventive care reviewed ?RTC 6 months ?

## 2022-01-12 NOTE — Progress Notes (Signed)
? ?Subjective:  ? ? Patient ID: Seth Baker, male    DOB: 1956-07-06, 66 y.o.   MRN: [id number] ? ?DOS:  01/12/2022 ?Type of visit - description: f/u ? ?Since the last office visit is doing well. ?Has no major concerns. ?Specifically denies chest pain or difficulty breathing ?No nausea vomiting.  No diarrhea ?No dysuria or gross hematuria ? ?Review of Systems ?See above  ? ?Past Medical History:  ?Diagnosis Date  ? Annual physical exam 06/09/2011  ? HTN (hypertension) 01/13/2007  ? Qualifier: Diagnosis of  By: Cletus Gash MD, [place]    ? Hyperlipidemia   ? on meds  ? HYPERLIPIDEMIA 04/22/2007  ? Annotation: Previously declined therapy Qualifier: Diagnosis of  By: Cletus Gash MD, [place]    ? Hypertension   ? on meds  ? SHOULDER PAIN, LEFT 01/31/2008  ? Centricity Description: SHOULDER PAIN, LEFT Qualifier: Diagnosis of  By: Larose Kells MD, [place]  Centricity Description: SHOULDER PAIN, RIGHT Qualifier: Diagnosis of  By: Larose Kells MD, [place] SKIN LESION 11/29/2009  ? Qualifier: Diagnosis of  By: Larose Kells MD, [place]   ? Tubular adenoma of colon 09/26/2014  ? ? ?Past Surgical History:  ?Procedure Laterality Date  ? COLONOSCOPY  08/2014  ? JMP- 5 yr recall  ? LAPAROSCOPIC INGUINAL HERNIA REPAIR Right   ? LAPAROSCOPIC INGUINAL HERNIA REPAIR Left   ? NASAL SINUS SURGERY    ? in the 1980's in [place]  ? Perianal lesion removal  2015  ? Dr. Johney Maine  ? ROTATOR CUFF REPAIR Left 2018  ? ? ?Current Outpatient Medications  ?Medication Instructions  ? amLODipine (NORVASC) 5 MG tablet Take 1 tablet by mouth once daily  ? aspirin 81 mg, Daily  ? atenolol (TENORMIN) 50 MG tablet TAKE 1/2 (ONE-HALF) TABLET BY MOUTH IN THE MORNING AND 1/2 (ONE-HALF) TABLET IN THE EVENING  ? ezetimibe (ZETIA) 10 MG tablet Take 1 tablet by mouth once daily  ? Flaxseed, Linseed, (FLAXSEED OIL PO) 1 tablet, Oral, 3 times daily, Unknown strength  ? fluticasone (FLONASE) 50 MCG/ACT nasal spray 1 spray, Each Nare, 2 times daily  ? ramipril (ALTACE) 10 MG capsule Take 1  capsule by mouth once daily  ? rosuvastatin (CRESTOR) 10 MG tablet Take 1 tablet by mouth once daily  ? ? ?   ?Objective:  ? Physical Exam ?BP 124/63 (BP Location: Right Arm, Cuff Size: Normal)   Pulse (!) 52   Temp 97.9 ?F (36.6 ?C) (Oral)   Resp 12   Ht '5\' 10"'$  (1.778 m)   Wt 153 lb 3.2 oz (69.5 kg)   SpO2 100%   BMI 21.98 kg/m?  ?General: ?Well developed, NAD, BMI noted ?Neck: No  thyromegaly  ?HEENT:  ?Normocephalic . Face symmetric, atraumatic ?Lungs:  ?CTA B ?Normal respiratory effort, no intercostal retractions, no accessory muscle use. ?Heart: RRR,  no murmur.  ?Abdomen:  ?Not distended, soft, non-tender. No rebound or rigidity.   ?Lower extremities: no pretibial edema bilaterally  ?Skin: Exposed areas without rash. Not pale. Not jaundice ?DRE: Normal sphincter tone, brown stools, prostate normal ?Neurologic:  ?alert & oriented X3.  ?Speech normal, gait appropriate for age and unassisted ?Strength symmetric and appropriate for age.  ?Psych: ?Cognition and judgment appear intact.  ?Cooperative with normal attention span and concentration.  ?Behavior appropriate. ?No anxious or depressed appearing. ? ?   ?Assessment   ? ? ?Assessment  ?HTN ?Hyperlipidemia ?06-2014 carotid US  (1-39%), October 2019 and December 2021: 1-39% stable.  ?Coronary  calcium score: Elevated, saw cardiology 03-2021.  Plan: CV RF control ? ?PLAN: ?HTN: BP is very good, doing well with diet.  Continue Tenormin, Altace.  Checking a CMP ?Hyperlipidemia: On Zetia and Crestor, well controlled ?Calcium coronary score slightly elevated, plan is to control CV risk factors. ?Preventive care reviewed ?RTC 6 months ? ? ? ? ?This visit occurred during the SARS-CoV-2 public health emergency.  Safety protocols were in place, including screening questions prior to the visit, additional usage of staff PPE, and extensive cleaning of exam room while observing appropriate contact time as indicated for disinfecting solutions.  ? ?

## 2022-01-12 NOTE — Assessment & Plan Note (Signed)
-  Tdap 2012.  Recommend booster ?-  S/p shingrix ?- PNM 20: 2022 ?- covid vax booster is an option  ?-CCS: ?virtual colonoscopy 5-07: neg; Colonoscopy  2015; cscope 06-2021 next per GI ?-Prostate ca screening: DRE normal, check PSA ?-ACP d/w pt ? ? ?

## 2022-01-12 NOTE — Patient Instructions (Addendum)
Please schedule a Medicare Wellness visit.  ? ? ?Check the  blood pressure regularly ?BP GOAL is between 110/65 and  135/85. ?If it is consistently higher or lower, let me know ?  ?GO TO THE LAB : Get the blood work   ? ? ?[place], [place] ?Come back for a checkup in 6 months ? ? ? ? ? ?"Living will", "Health Care Power of attorney": Advanced care planning ? ?(If you already have a living will or healthcare power of attorney, please bring the copy to be scanned in your chart.) ? ?Advance care planning is a process that supports adults in  understanding and sharing their preferences regarding future medical care.  ? ?The patient's preferences are recorded in documents called Advance Directives.    ?Advanced directives are completed (and can be modified at any time) while the patient is in full mental capacity.  ? ?The documentation should be available at all times to the patient, the family and the healthcare providers.  ?Bring in a copy to be scanned in your chart is an excellent idea and is recommended  ? ?This legal documents direct treatment decision making and/or appoint a surrogate to make the decision if the patient is not capable to do so.  ? ? ?Advance directives can be documented in many types of formats,  documents have names such as:  ?Lliving will  ?Durable power of attorney for healthcare (healthcare proxy or healthcare power of attorney)  ?Combined directives  ?Physician orders for life-sustaining treatment  ?  ?More information at: ? [email or web address]  ?

## 2022-01-30 ENCOUNTER — Other Ambulatory Visit: Payer: Self-pay | Admitting: Internal Medicine

## 2022-01-30 ENCOUNTER — Other Ambulatory Visit: Payer: Self-pay | Admitting: Cardiology

## 2022-03-09 ENCOUNTER — Other Ambulatory Visit: Payer: Self-pay | Admitting: Internal Medicine

## 2022-03-31 ENCOUNTER — Other Ambulatory Visit: Payer: Self-pay | Admitting: Internal Medicine

## 2022-04-27 ENCOUNTER — Ambulatory Visit (INDEPENDENT_AMBULATORY_CARE_PROVIDER_SITE_OTHER): Payer: Medicare Other | Admitting: Cardiology

## 2022-04-27 ENCOUNTER — Encounter: Payer: Self-pay | Admitting: Cardiology

## 2022-04-27 VITALS — BP 118/66 | HR 49 | Ht 70.0 in | Wt 156.6 lb

## 2022-04-27 DIAGNOSIS — E782 Mixed hyperlipidemia: Secondary | ICD-10-CM | POA: Diagnosis not present

## 2022-04-27 DIAGNOSIS — I2510 Atherosclerotic heart disease of native coronary artery without angina pectoris: Secondary | ICD-10-CM | POA: Diagnosis not present

## 2022-04-27 DIAGNOSIS — I10 Essential (primary) hypertension: Secondary | ICD-10-CM | POA: Diagnosis not present

## 2022-04-27 NOTE — Progress Notes (Signed)
Cardiology Office Note:    Date:  04/27/2022   ID:  Seth Baker, Seth Baker 06-23-1956, MRN [id number]  PCP:  Colon Branch, MD  Cardiologist:  Jenne Campus, MD    Referring MD: Colon Branch, MD   Chief Complaint  Patient presents with   Follow-up  Doing well  History of Present Illness:    Seth Baker is a 66 y.o. male with past medical history significant for high calcium score 540, however he is [place] completely asymptomatic also dyslipidemia essential hypertension, as he is originally from [place].  Comes today for follow-up he is doing very well he exercised on the regular basis he changed dramatically the way he eats however he find some cardiologist on the Internet who advised about diet and the diet includes some bacon.  We did discuss basic of Mediterranean diet and I recommended that diet for him.  Past Medical History:  Diagnosis Date   Annual physical exam 06/09/2011   HTN (hypertension) 01/13/2007   Qualifier: Diagnosis of  By: Cletus Gash MD, [place]     Hyperlipidemia    on meds   HYPERLIPIDEMIA 04/22/2007   Annotation: Previously declined therapy Qualifier: Diagnosis of  By: Cletus Gash MD, [place]     Hypertension    on meds   SHOULDER PAIN, LEFT 01/31/2008   Centricity Description: SHOULDER PAIN, LEFT Qualifier: Diagnosis of  By: Larose Kells MD, [place]  Centricity Description: SHOULDER PAIN, RIGHT Qualifier: Diagnosis of  By: Larose Kells MD, [place] LESION 11/29/2009   Qualifier: Diagnosis of  By: Larose Kells MD, [place]    Tubular adenoma of colon 09/26/2014    Past Surgical History:  Procedure Laterality Date   COLONOSCOPY  08/2014   JMP- 5 yr recall   LAPAROSCOPIC INGUINAL HERNIA REPAIR Right    LAPAROSCOPIC INGUINAL HERNIA REPAIR Left    NASAL SINUS SURGERY     in the 1980's in [place]   Perianal lesion removal  2015   Dr. Johney Maine   ROTATOR CUFF REPAIR Left 2018    Current Medications: Current Meds  Medication Sig   amLODipine (NORVASC) 5 MG tablet Take 1  tablet by mouth once daily (Patient taking differently: Take 5 mg by mouth daily.)   aspirin 81 MG tablet Take 81 mg by mouth daily.   atenolol (TENORMIN) 50 MG tablet TAKE 1/2 (ONE-HALF) TABLET BY MOUTH IN THE MORNING AND 1/2 (ONE-HALF) IN THE EVENING (Patient taking differently: Take 25 mg by mouth 2 (two) times daily. TAKE 1/2 (ONE-HALF) TABLET BY MOUTH IN THE MORNING AND 1/2 (ONE-HALF) IN THE EVENING)   ezetimibe (ZETIA) 10 MG tablet Take 1 tablet by mouth once daily (Patient taking differently: Take 10 mg by mouth daily.)   Flaxseed, Linseed, (FLAXSEED OIL PO) Take 1 tablet by mouth 3 (three) times daily. Unknown strength   fluticasone (FLONASE) 50 MCG/ACT nasal spray Place 1 spray into both nostrils 2 (two) times daily.   ramipril (ALTACE) 10 MG capsule Take 1 capsule by mouth once daily (Patient taking differently: Take 10 mg by mouth daily.)   rosuvastatin (CRESTOR) 10 MG tablet Take 1 tablet by mouth once daily (Patient taking differently: Take 10 mg by mouth daily.)     Allergies:   Atorvastatin and Niacin   Social History   Socioeconomic History   Marital status: Divorced    Spouse name: Not on file   Number of children: 3   Years of education: Not on file   Highest education  level: Not on file  Occupational History   Occupation: retired Scientist, product/process development  Tobacco Use   Smoking status: Never   Smokeless tobacco: Never  Vaping Use   Vaping Use: Never used  Substance and Sexual Activity   Alcohol use: Yes    Alcohol/week: 3.0 - 4.0 standard drinks of alcohol    Types: 3 - 4 Standard drinks or equivalent per week    Comment: socially    Drug use: No   Sexual activity: Not on file  Other Topics Concern   Not on file  Social History Narrative   divorced 2020, lives w/ older son   Has  3 children ,   Pt is  from [place]       Social Determinants of Radio broadcast assistant Strain: Not on file  Food Insecurity: Not on file  Transportation Needs: Not on file   Physical Activity: Not on file  Stress: Not on file  Social Connections: Not on file     Family History: The patient's family history includes Stroke (age of onset: 19) in his mother. There is no history of CAD, Diabetes, Colon cancer, Prostate cancer, Colon polyps, Rectal cancer, or Stomach cancer. ROS:   Please see the history of present illness.    All 14 point review of systems negative except as described per history of present illness  EKGs/Labs/Other Studies Reviewed:      Recent Labs: 06/26/2021: Hemoglobin 14.5; Platelets 235.0 01/12/2022: ALT 21; BUN 17; Creatinine, Ser 1.04; Potassium 4.7; Sodium 140  Recent Lipid Panel    Component Value Date/Time   CHOL 109 12/02/2021 0913   TRIG 83 12/02/2021 0913   TRIG 113 09/06/2006 0828   HDL 46 12/02/2021 0913   CHOLHDL 2.4 12/02/2021 0913   CHOLHDL 4 08/26/2020 0942   VLDL 29.4 08/26/2020 0942   LDLCALC 46 12/02/2021 0913   LDLCALC 140 (H) 07/09/2020 0945   LDLDIRECT 143.4 04/18/2007 0818    Physical Exam:    VS:  BP 118/66 (BP Location: Left Arm, Patient Position: Sitting)   Pulse (!) 49   Ht '5\' 10"'$  (1.778 m)   Wt 156 lb 9.6 oz (71 kg)   SpO2 99%   BMI 22.47 kg/m     Wt Readings from Last 3 Encounters:  04/27/22 156 lb 9.6 oz (71 kg)  01/12/22 153 lb 3.2 oz (69.5 kg)  10/23/21 154 lb (69.9 kg)     GEN:  Well nourished, well developed in no acute distress HEENT: Normal NECK: No JVD; No carotid bruits LYMPHATICS: No lymphadenopathy CARDIAC: RRR, no murmurs, no rubs, no gallops RESPIRATORY:  Clear to auscultation without rales, wheezing or rhonchi  ABDOMEN: Soft, non-tender, non-distended MUSCULOSKELETAL:  No edema; No deformity  SKIN: Warm and dry LOWER EXTREMITIES: no swelling NEUROLOGIC:  Alert and oriented x 3 PSYCHIATRIC:  Normal affect   ASSESSMENT:    1. Coronary artery disease involving native coronary artery of native heart without angina pectoris   2. Primary hypertension   3. Mixed  hyperlipidemia    PLAN:    In order of problems listed above:  Coronary disease in form of calcification of coronary artery but completely asymptomatic the key is risk factors modifications, he is on antiplatelet therapy he is also on statin which I will continue. Dyslipidemia I did review his K PN which show me his LDL of a 46 and HDL 46 we will continue present management Essential hypertension blood pressure is excellently controlled we will continue present  management.   Medication Adjustments/Labs and Tests Ordered: Current medicines are reviewed at length with the patient today.  Concerns regarding medicines are outlined above.  No orders of the defined types were placed in this encounter.  Medication changes: No orders of the defined types were placed in this encounter.   Signed, Park Liter, MD, [facility] 04/27/2022 11:07 AM    [place]

## 2022-04-27 NOTE — Patient Instructions (Signed)

## 2022-05-18 ENCOUNTER — Other Ambulatory Visit: Payer: Self-pay | Admitting: Internal Medicine

## 2022-07-14 ENCOUNTER — Encounter: Payer: Self-pay | Admitting: Internal Medicine

## 2022-07-14 ENCOUNTER — Ambulatory Visit (INDEPENDENT_AMBULATORY_CARE_PROVIDER_SITE_OTHER): Payer: Medicare Other | Admitting: Internal Medicine

## 2022-07-14 VITALS — BP 122/60 | HR 56 | Temp 97.6°F | Resp 16 | Ht 70.0 in | Wt 154.2 lb

## 2022-07-14 DIAGNOSIS — L723 Sebaceous cyst: Secondary | ICD-10-CM | POA: Diagnosis not present

## 2022-07-14 DIAGNOSIS — I10 Essential (primary) hypertension: Secondary | ICD-10-CM | POA: Diagnosis not present

## 2022-07-14 DIAGNOSIS — E785 Hyperlipidemia, unspecified: Secondary | ICD-10-CM

## 2022-07-14 DIAGNOSIS — I2510 Atherosclerotic heart disease of native coronary artery without angina pectoris: Secondary | ICD-10-CM | POA: Diagnosis not present

## 2022-07-14 DIAGNOSIS — Z23 Encounter for immunization: Secondary | ICD-10-CM

## 2022-07-14 LAB — BASIC METABOLIC PANEL
BUN: 17 mg/dL (ref 6–23)
CO2: 30 mEq/L (ref 19–32)
Calcium: 9.6 mg/dL (ref 8.4–10.5)
Chloride: 103 mEq/L (ref 96–112)
Creatinine, Ser: 1.04 mg/dL (ref 0.40–1.50)
GFR: 74.64 mL/min (ref >60.00)
Glucose, Bld: 96 mg/dL (ref 70–99)
Potassium: 4.3 mEq/L (ref 3.5–5.1)
Sodium: 138 mEq/L (ref 135–145)

## 2022-07-14 LAB — CBC WITH DIFFERENTIAL/PLATELET
Basophils Absolute: 0 10*3/uL (ref 0.0–0.1)
Basophils Relative: 0.8 % (ref 0.0–3.0)
Eosinophils Absolute: 0.2 10*3/uL (ref 0.0–0.7)
Eosinophils Relative: 3.5 % (ref 0.0–5.0)
HCT: 41.7 % (ref 39.0–52.0)
Hemoglobin: 14.2 g/dL (ref 13.0–17.0)
Lymphocytes Relative: 19.4 % (ref 12.0–46.0)
Lymphs Abs: 1.1 10*3/uL (ref 0.7–4.0)
MCHC: 34.1 g/dL (ref 30.0–36.0)
MCV: 92.7 fl (ref 78.0–100.0)
Monocytes Absolute: 0.4 10*3/uL (ref 0.1–1.0)
Monocytes Relative: 7.4 % (ref 3.0–12.0)
Neutro Abs: 3.9 10*3/uL (ref 1.4–7.7)
Neutrophils Relative %: 68.9 % (ref 43.0–77.0)
Platelets: 234 10*3/uL (ref 150.0–400.0)
RBC: 4.5 Mil/uL (ref 4.22–5.81)
RDW: 13.6 % (ref 11.5–15.5)
WBC: 5.6 10*3/uL (ref 4.0–10.5)

## 2022-07-14 MED ORDER — CEPHALEXIN 500 MG PO CAPS: 500.0000 mg | ORAL_CAPSULE | Freq: Four times a day (QID) | ORAL | 0 refills | Status: DC

## 2022-07-14 NOTE — Progress Notes (Unsigned)
Subjective:    Patient ID: Seth Baker, male    DOB: 18-Dec-1955, 66 y.o.   MRN: [id number]  DOS:  07/14/2022 Type of visit - description: Follow-up . Saw cardiology, note reviewed. Still concerned about statins, has aches and pains after being active. Also, has noted lump, occasional tender, occasional red, located  at the left chest.  Denies chest pain or difficulty breathing.  No nausea or vomiting.  Review of Systems See above   Past Medical History:  Diagnosis Date   Annual physical exam 06/09/2011   HTN (hypertension) 01/13/2007   Qualifier: Diagnosis of  By: Cletus Gash MD, [place]     Hyperlipidemia    on meds   HYPERLIPIDEMIA 04/22/2007   Annotation: Previously declined therapy Qualifier: Diagnosis of  By: Cletus Gash MD, [place]     Hypertension    on meds   SHOULDER PAIN, LEFT 01/31/2008   Centricity Description: SHOULDER PAIN, LEFT Qualifier: Diagnosis of  By: Larose Kells MD, [place]  Centricity Description: SHOULDER PAIN, RIGHT Qualifier: Diagnosis of  By: Larose Kells MD, [place] LESION 11/29/2009   Qualifier: Diagnosis of  By: Larose Kells MD, [place]    Tubular adenoma of colon 09/26/2014    Past Surgical History:  Procedure Laterality Date   COLONOSCOPY  08/2014   JMP- 5 yr recall   LAPAROSCOPIC INGUINAL HERNIA REPAIR Right    LAPAROSCOPIC INGUINAL HERNIA REPAIR Left    NASAL SINUS SURGERY     in the 1980's in [place]   Perianal lesion removal  2015   Dr. Johney Maine   ROTATOR CUFF REPAIR Left 2018    Current Outpatient Medications  Medication Instructions   amLODipine (NORVASC) 5 mg, Oral, Daily   aspirin 81 mg, Daily   atenolol (TENORMIN) 50 MG tablet TAKE 1/2 (ONE-HALF) TABLET BY MOUTH IN THE MORNING AND 1/2 (ONE-HALF) IN THE EVENING   ezetimibe (ZETIA) 10 MG tablet Take 1 tablet by mouth once daily   Flaxseed, Linseed, (FLAXSEED OIL PO) 1 tablet, Oral, 3 times daily, Unknown strength   fluticasone (FLONASE) 50 MCG/ACT nasal spray 1 spray, Each Nare, 2 times daily    ramipril (ALTACE) 10 MG capsule Take 1 capsule by mouth once daily   rosuvastatin (CRESTOR) 10 MG tablet Take 1 tablet by mouth once daily       Objective:   Physical Exam Chest:      BP 122/60   Pulse (!) 56   Temp 97.6 F (36.4 C) (Oral)   Resp 16   Ht '5\' 10"'$  (1.778 m)   Wt 154 lb 4 oz (70 kg)   SpO2 96%   BMI 22.13 kg/m  General:   Well developed, NAD, BMI noted. HEENT:  Normocephalic . Face symmetric, atraumatic Lower extremities: no pretibial edema bilaterally  Skin: See graphic neurologic:  alert & oriented X3.  Speech normal, gait appropriate for age and unassisted Psych--  Cognition and judgment appear intact.  Cooperative with normal attention span and concentration.  Behavior appropriate. No anxious or depressed appearing.      Assessment     Assessment  HTN Hyperlipidemia 06-2014 carotid US  (1-39%), October 2019 and December 2021: 1-39% stable.  CAD---Coronary calcium score: Elevated, saw cardiology 03-2021.  Plan: CV RF control  PLAN: HTN: Continue amlodipine, Altace, Tenormin. Ambulatory BPs in the 130/70 or better Check BMP and CBC.  Further advised with results CAD: Has calcifications in the coronary arteries, asymptomatic, saw cardiology, August 2023, no changes recommended High cholesterol:  Still concerned about possible side effects from Crestor, advised benefits >> risk.  Recommend to continue Crestor and Zetia. Abscess: Has a lump at the left chest, see physical exam, likely a infected sebaceous cyst.  Recommend Keflex, call if not gradually better. Wonders if he should go see the dermatologist on that is okay if he so desires. Preventive care: Flu shot today, recommended Tdap, asked my opinion about COVID-vaccine: pros> cons RTC 6 months

## 2022-07-14 NOTE — Patient Instructions (Addendum)
Vaccines I recommend:  Tdap (tetanus)  Take antibiotic for 1 week. In the area on your chest is not looking better let us know. Consider see your dermatologist  Check the  blood pressure regularly BP GOAL is between 110/65 and  135/85. If it is consistently higher or lower, let me know    GO TO THE LAB : Get the blood work     [place], [place] back for checkup in 6 months

## 2022-07-15 NOTE — Assessment & Plan Note (Signed)
HTN: Continue amlodipine, Altace, Tenormin. Ambulatory BPs in the 130/70 or better Check BMP and CBC.  Further advised with results CAD: Has calcifications in the coronary arteries, asymptomatic, saw cardiology, August 2023, no changes recommended High cholesterol: Still concerned about possible side effects from Crestor, advised benefits >> risk.  Recommend to continue Crestor and Zetia. Abscess: Has a lump at the left chest, see physical exam, likely a infected sebaceous cyst.  Recommend Keflex, call if not gradually better. Wonders if he should go see the dermatologist on that is okay if he so desires. Preventive care: Flu shot today, recommended Tdap, asked my opinion about COVID-vaccine: pros> cons RTC 6 months

## 2022-07-28 ENCOUNTER — Other Ambulatory Visit: Payer: Self-pay | Admitting: Cardiology

## 2022-07-28 ENCOUNTER — Other Ambulatory Visit: Payer: Self-pay | Admitting: Internal Medicine

## 2022-09-08 ENCOUNTER — Other Ambulatory Visit: Payer: Self-pay | Admitting: Internal Medicine

## 2022-09-10 IMAGING — CT CT CARDIAC CORONARY ARTERY CALCIUM SCORE
2 series · 15 of 20 positions shown, 17 images · non-contrast
Comparison: None.
COMPARISON: None.

Addendum:
EXAM:
OVER-READ INTERPRETATION  CT CHEST

The following report is an over-read performed by radiologist Dr.
Visconde Loy [[email or web address]] on 12/31/2020. This over-read
does not include interpretation of cardiac or coronary anatomy or
pathology. The coronary calcium score interpretation by the
cardiologist is attached.
CLINICAL DATA: Cardiovascular Disease Risk stratification
Coronary Calcium Score
TECHNIQUE: A gated, non-contrast computed tomography scan of the heart was
performed using 3mm slice thickness. Axial images were analyzed on a
dedicated workstation. Calcium scoring of the coronary arteries was
performed using the Agatston method.

[Series 2: casc 3.0 i36f 2 bestdiast 71 % · axial · 0.39mm/px · z∈[-244,-136]mm · 8 of 48 slices shown, 10 images]
[im 6/48  vessel]
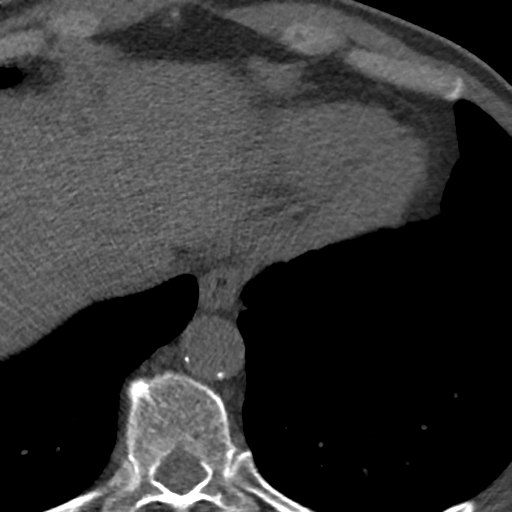
[im 6/48  lung]
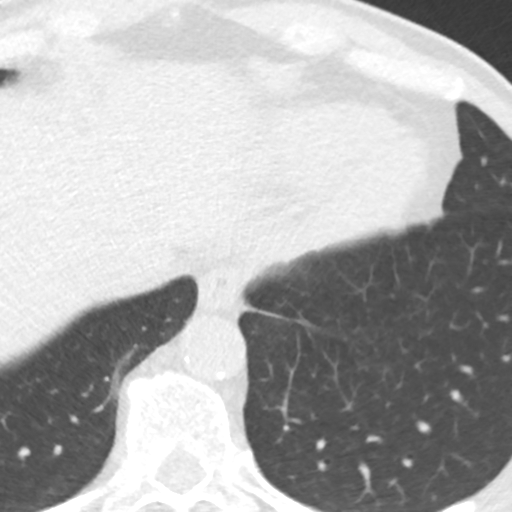
[im 11/48  vessel]
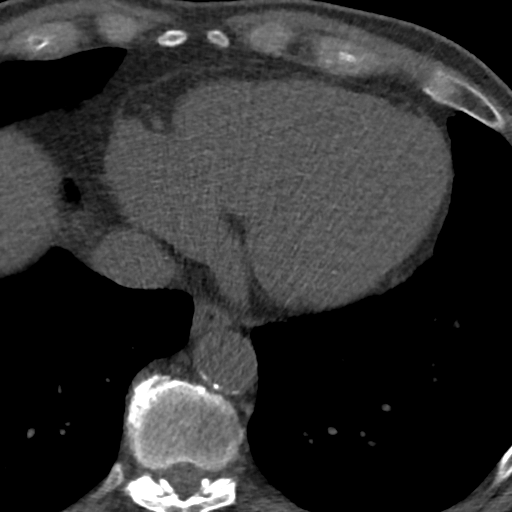
[im 16/48  vessel]
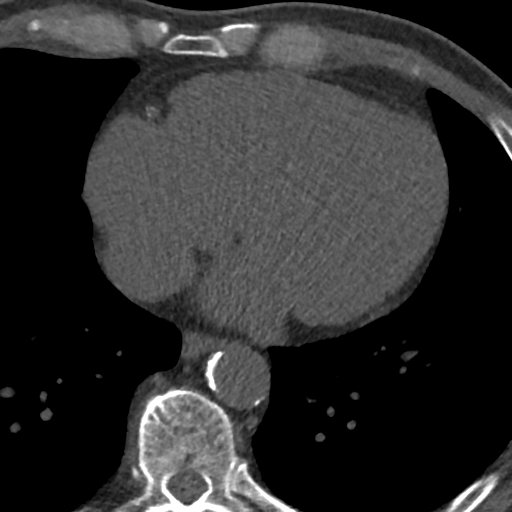
[im 21/48  vessel]
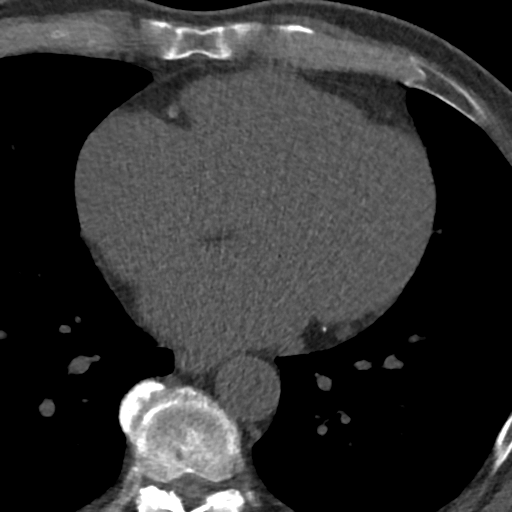
[im 27/48  vessel]
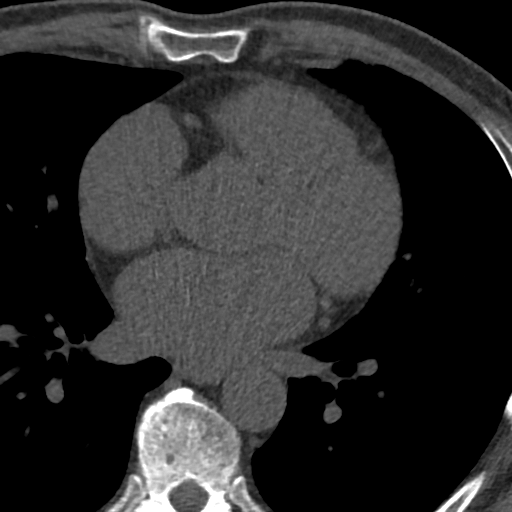
[im 27/48  lung]
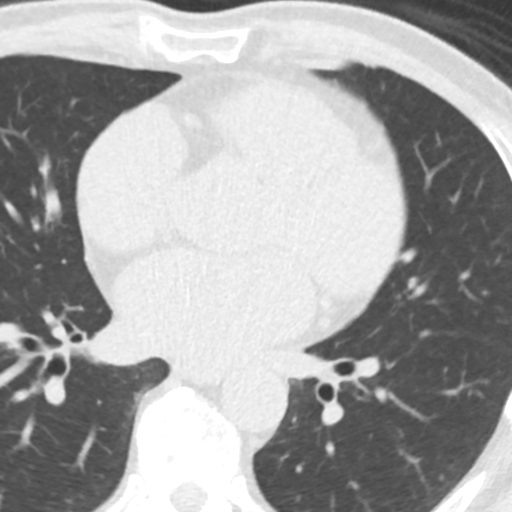
[im 32/48  vessel]
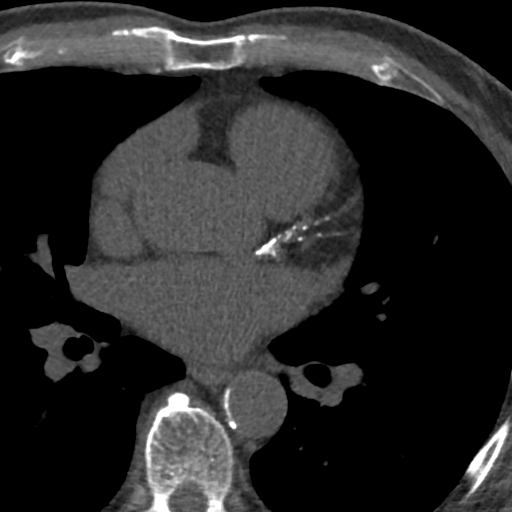
[im 37/48  vessel]
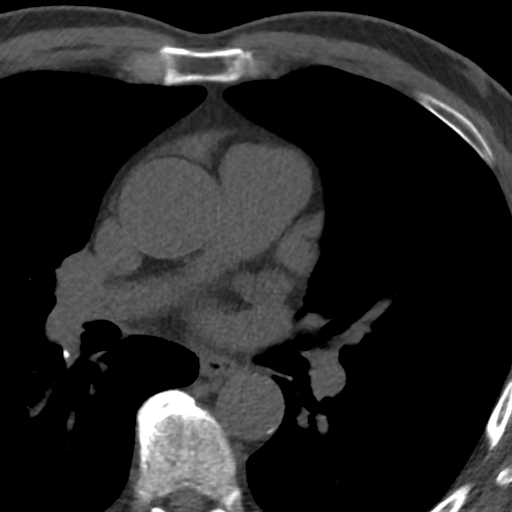
[im 42/48  vessel]
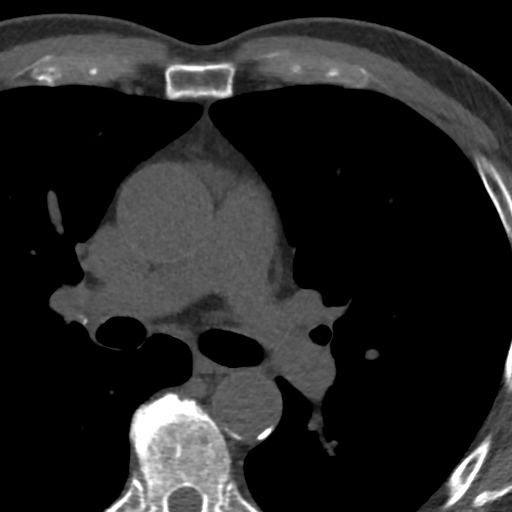

[Series 4: lung st 71 % · axial · 0.72mm/px · z∈[-244,-151]mm · 7 of 48 slices shown]
[im 6/48  lung]
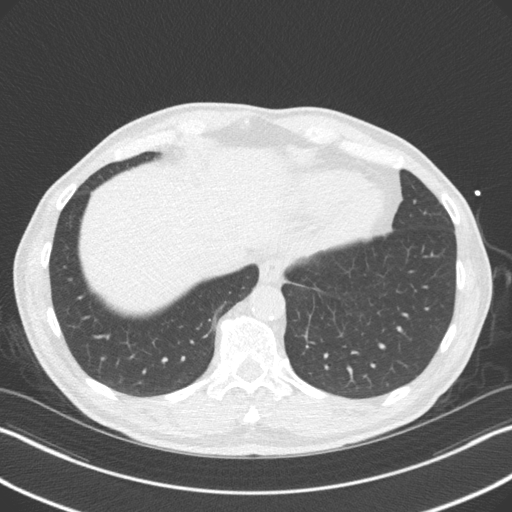
[im 11/48  lung]
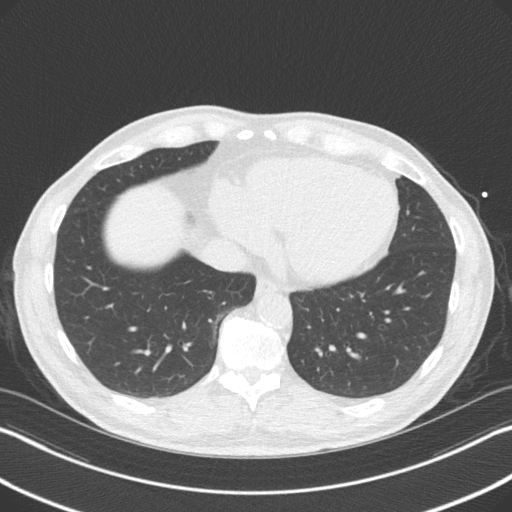
[im 16/48  lung]
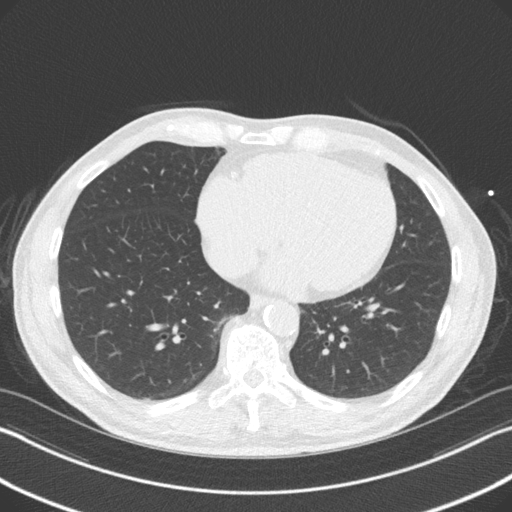
[im 21/48  lung]
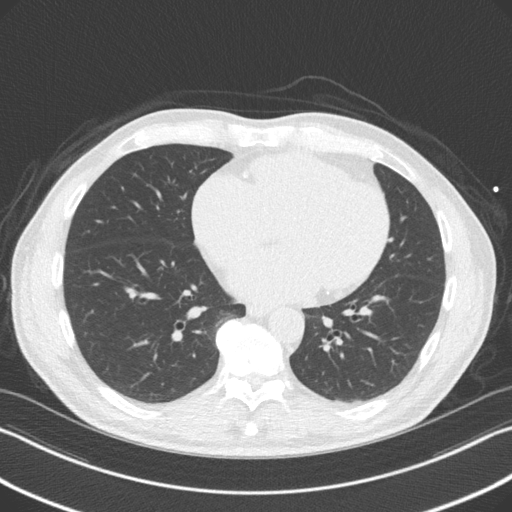
[im 27/48  lung]
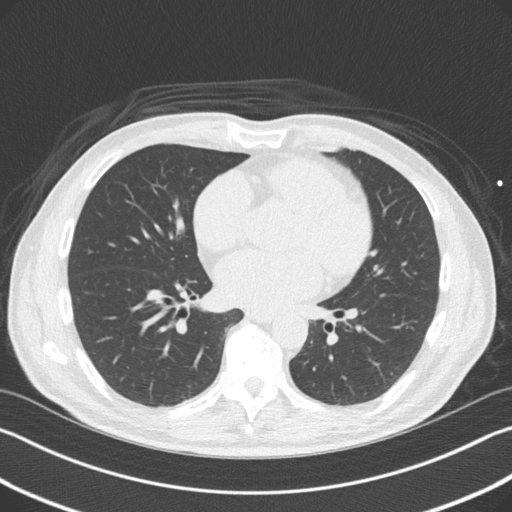
[im 32/48  lung]
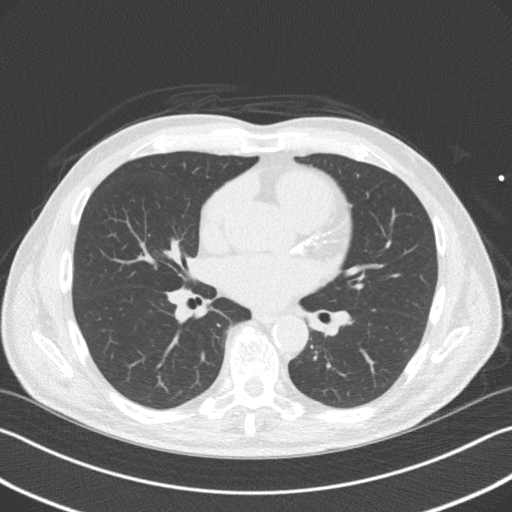
[im 37/48  lung]
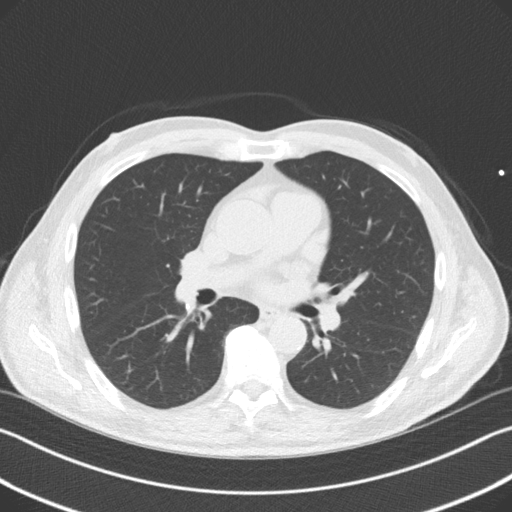

[15 of 20 positions shown; findings below may reference images not displayed]

FINDINGS: Vascular: Heart is upper limits normal in size. Aorta normal caliber
measuring up to 3.7 cm in the ascending thoracic aorta.
Calcifications in the descending thoracic aorta.

Mediastinum/Nodes: No adenopathy

Lungs/Pleura: No confluent opacities or effusions.

Upper Abdomen: Insert for abdomen

Musculoskeletal: Chest wall soft tissues are unremarkable. No acute
bony abnormality.
IMPRESSION: Aortic atherosclerosis.

No acute extra cardiac abnormality.
FINDINGS: Coronary arteries: Normal origins.

Coronary Calcium Score:

Left main: 0

Left anterior descending artery: 361

Left circumflex artery: 45

Right coronary artery: 136

Total: 542

Percentile: 73

Pericardium: Normal.

Aorta: Normal caliber of ascending aorta. Aortic atherosclerosis
noted.

Non-cardiac: See separate report from [REDACTED].
IMPRESSION: Coronary calcium score of 542. This was 73rd percentile for age-,
race-, and sex-matched controls.

Aortic atherosclerosis.



If CAC=0, it is reasonable to withhold statin therapy and reassess
in 5 to 10 years, as long as higher risk conditions are absent
(diabetes mellitus, family history of premature CHD in first degree
relatives (males <55 years; females <65 years), cigarette smoking,
or LDL >=190 mg/dL).

If CAC is 1 to 99, it is reasonable to initiate statin therapy for
patients >=55 years of age.

If CAC is >=100 or >=75th percentile, it is reasonable to initiate
statin therapy at any age.

Cardiology referral should be considered for patients with CAC
scores >=400 or >=75th percentile.

*9344 AHA/ACC/AACVPR/AAPA/ABC/EISER/PETROVIC/XUOSEN FARAAX/[place]/BA[place]WA/VAHEED/LOOI
Guideline on the Management of Blood Cholesterol: A Report of the
American College of Cardiology/American Heart Association Task Force
on Clinical Practice Guidelines. J Am Coll Cardiol.
1145;73(24):5917-5716.

*** End of Addendum ***
EXAM:
OVER-READ INTERPRETATION  CT CHEST

The following report is an over-read performed by radiologist Dr.
Visconde Loy [[email or web address]] on 12/31/2020. This over-read
does not include interpretation of cardiac or coronary anatomy or
pathology. The coronary calcium score interpretation by the
cardiologist is attached.
FINDINGS: Vascular: Heart is upper limits normal in size. Aorta normal caliber
measuring up to 3.7 cm in the ascending thoracic aorta.
Calcifications in the descending thoracic aorta.

Mediastinum/Nodes: No adenopathy

Lungs/Pleura: No confluent opacities or effusions.

Upper Abdomen: Insert for abdomen

Musculoskeletal: Chest wall soft tissues are unremarkable. No acute
bony abnormality.
IMPRESSION: Aortic atherosclerosis.

No acute extra cardiac abnormality.

## 2023-01-20 ENCOUNTER — Ambulatory Visit (INDEPENDENT_AMBULATORY_CARE_PROVIDER_SITE_OTHER): Payer: Medicare Other | Admitting: Internal Medicine

## 2023-01-20 ENCOUNTER — Encounter: Payer: Self-pay | Admitting: Internal Medicine

## 2023-01-20 VITALS — BP 130/76 | HR 56 | Temp 97.8°F | Resp 16 | Ht 70.0 in | Wt 154.1 lb

## 2023-01-20 DIAGNOSIS — R972 Elevated prostate specific antigen [PSA]: Secondary | ICD-10-CM | POA: Diagnosis not present

## 2023-01-20 DIAGNOSIS — E782 Mixed hyperlipidemia: Secondary | ICD-10-CM | POA: Diagnosis not present

## 2023-01-20 DIAGNOSIS — I10 Essential (primary) hypertension: Secondary | ICD-10-CM | POA: Diagnosis not present

## 2023-01-20 DIAGNOSIS — Z09 Encounter for follow-up examination after completed treatment for conditions other than malignant neoplasm: Secondary | ICD-10-CM

## 2023-01-20 DIAGNOSIS — R739 Hyperglycemia, unspecified: Secondary | ICD-10-CM | POA: Diagnosis not present

## 2023-01-20 LAB — LIPID PANEL
Cholesterol: 110 mg/dL (ref 0–200)
HDL: 46 mg/dL (ref >39.00)
LDL Cholesterol: 46 mg/dL (ref 0–99)
NonHDL: 63.75
Total CHOL/HDL Ratio: 2
Triglycerides: 90 mg/dL (ref 0.0–149.0)
VLDL: 18 mg/dL (ref 0.0–40.0)

## 2023-01-20 LAB — BASIC METABOLIC PANEL
BUN: 14 mg/dL (ref 6–23)
CO2: 28 mEq/L (ref 19–32)
Calcium: 9.7 mg/dL (ref 8.4–10.5)
Chloride: 102 mEq/L (ref 96–112)
Creatinine, Ser: 1.1 mg/dL (ref 0.40–1.50)
GFR: 69.52 mL/min (ref >60.00)
Glucose, Bld: 87 mg/dL (ref 70–99)
Potassium: 4.6 mEq/L (ref 3.5–5.1)
Sodium: 138 mEq/L (ref 135–145)

## 2023-01-20 LAB — PSA: PSA: 3.64 ng/mL (ref 0.10–4.00)

## 2023-01-20 LAB — HEMOGLOBIN A1C: Hgb A1c MFr Bld: 5.4 % (ref 4.6–6.5)

## 2023-01-20 LAB — ALT: ALT: 26 U/L (ref 0–53)

## 2023-01-20 LAB — AST: AST: 34 U/L (ref 0–37)

## 2023-01-20 NOTE — Patient Instructions (Addendum)
Vaccines I recommend: Tdap (tetanus) RSV vaccine And flu shot every fall   Check the  blood pressure regularly BP GOAL is between 110/65 and  135/85. If it is consistently higher or lower, let me know     GO TO THE LAB : Get the blood work     GO TO THE FRONT DESK, PLEASE SCHEDULE YOUR APPOINTMENTS Come back for   a checkup in 6 months   [email or web address]

## 2023-01-20 NOTE — Progress Notes (Unsigned)
Subjective:    Patient ID: Seth Baker, male    DOB: Jun 19, 1956, 67 y.o.   MRN: [id number]  DOS:  01/20/2023 Type of visit - description: Routine visit Since the last office visit is doing well. He remains active. Ambulatory BPs normal Denies chest pain difficulty breathing or lower extremity edema. He has a "sweet tooth" and would like his sugar to be checked.    Review of Systems See above   Past Medical History:  Diagnosis Date   Annual physical exam 06/09/2011   HTN (hypertension) 01/13/2007   Qualifier: Diagnosis of  By: Blossom Hoops MD, Luis     Hyperlipidemia    on meds   HYPERLIPIDEMIA 04/22/2007   Annotation: Previously declined therapy Qualifier: Diagnosis of  By: Blossom Hoops MD, Luis     Hypertension    on meds   SHOULDER PAIN, LEFT 01/31/2008   Centricity Description: SHOULDER PAIN, LEFT Qualifier: Diagnosis of  By: Drue Novel MD, Nolon Rod.  Centricity Description: SHOULDER PAIN, RIGHT Qualifier: Diagnosis of  By: Drue Novel MD, Bryar Dahms E.    SKIN LESION 11/29/2009   Qualifier: Diagnosis of  By: Drue Novel MD, Nolon Rod.    Tubular adenoma of colon 09/26/2014    Past Surgical History:  Procedure Laterality Date   COLONOSCOPY  08/2014   JMP- 5 yr recall   LAPAROSCOPIC INGUINAL HERNIA REPAIR Right    LAPAROSCOPIC INGUINAL HERNIA REPAIR Left    NASAL SINUS SURGERY     in the 1980's in [place]   Perianal lesion removal  2015   Dr. Michaell Cowing   ROTATOR CUFF REPAIR Left 2018    Current Outpatient Medications  Medication Instructions   amLODipine (NORVASC) 5 mg, Oral, Daily   aspirin 81 mg, Daily   atenolol (TENORMIN) 25 mg, Oral, 2 times daily   cephALEXin (KEFLEX) 500 mg, Oral, 4 times daily   ezetimibe (ZETIA) 10 mg, Oral, Daily   Flaxseed, Linseed, (FLAXSEED OIL PO) 1 tablet, Oral, 3 times daily, Unknown strength   fluticasone (FLONASE) 50 MCG/ACT nasal spray 1 spray, Each Nare, 2 times daily   ramipril (ALTACE) 10 MG capsule Take 1 capsule by mouth once daily   rosuvastatin  (CRESTOR) 10 MG tablet Take 1 tablet by mouth once daily       Objective:   Physical Exam BP 130/76   Pulse (!) 56   Temp 97.8 F (36.6 C) (Oral)   Resp 16   Ht 5\' 10"  (1.778 m)   Wt 154 lb 2 oz (69.9 kg)   SpO2 99%   BMI 22.11 kg/m  General:   Well developed, NAD, BMI noted. HEENT:  Normocephalic . Face symmetric, atraumatic Lungs:  CTA B Normal respiratory effort, no intercostal retractions, no accessory muscle use. Heart: RRR,  no murmur.  Lower extremities: no pretibial edema bilaterally  Skin: Not pale. Not jaundice Neurologic:  alert & oriented X3.  Speech normal, gait appropriate for age and unassisted Psych--  Cognition and judgment appear intact.  Cooperative with normal attention span and concentration.  Behavior appropriate. No anxious or depressed appearing.      Assessment     Assessment  HTN Hyperlipidemia 06-2014 carotid US  (1-39%), October 2019 and December 2021: 1-39% stable.  CAD---Coronary calcium score: Elevated, saw cardiology 03-2021.  Plan: CV RF control  PLAN: HTN: BP today is very good, at home he gets similar readings today 130/80, continue with amlodipine, atenolol, Altace.  Check BMP. Hyperlipidemia: On Zetia and Crestor.  Check FLP AST ALT. CAD:  Asymptomatic.  Continue aspirin and the other medications Blood sugar: Request check.  Will get A1c Preventive care: Vaccine advice provided, check PSA. RTC 6 months

## 2023-01-21 NOTE — Assessment & Plan Note (Addendum)
HTN: BP today is very good, at home he gets similar readings today 130/80, continue with amlodipine, atenolol, Altace.  Check BMP. Hyperlipidemia: On Zetia and Crestor.  Check FLP AST ALT. CAD: Asymptomatic.  Continue aspirin and the other medications Blood sugar: Request check.  Will get A1c Preventive care: Vaccine advice provided, check PSA d/t slr increased velocity. RTC 6 months

## 2023-01-27 ENCOUNTER — Other Ambulatory Visit: Payer: Self-pay | Admitting: Internal Medicine

## 2023-03-05 ENCOUNTER — Other Ambulatory Visit: Payer: Self-pay | Admitting: Internal Medicine

## 2023-03-19 ENCOUNTER — Other Ambulatory Visit: Payer: Self-pay | Admitting: Internal Medicine

## 2023-04-24 ENCOUNTER — Other Ambulatory Visit: Payer: Self-pay | Admitting: Cardiology

## 2023-04-26 ENCOUNTER — Other Ambulatory Visit: Payer: Self-pay | Admitting: Cardiology

## 2023-05-13 ENCOUNTER — Other Ambulatory Visit: Payer: Self-pay | Admitting: Internal Medicine

## 2023-05-26 ENCOUNTER — Telehealth: Payer: Self-pay | Admitting: Internal Medicine

## 2023-05-26 ENCOUNTER — Other Ambulatory Visit: Payer: Self-pay | Admitting: Cardiology

## 2023-05-26 MED ORDER — ATENOLOL 50 MG PO TABS: 25.0000 mg | ORAL_TABLET | Freq: Two times a day (BID) | ORAL | 1 refills | Status: DC

## 2023-05-26 NOTE — Telephone Encounter (Signed)
Pt states he would like pcp to take over his atenolol (TENORMIN) 50 MG tablet. States his cardiology office needs him to make an appt before they will give out additional refills and they are booked too far out. He states he longer wants to see his cardiology office.

## 2023-05-26 NOTE — Telephone Encounter (Signed)
Okay to RF x 6 months

## 2023-05-26 NOTE — Telephone Encounter (Signed)
Please advise 

## 2023-05-26 NOTE — Telephone Encounter (Signed)
Rx sent 

## 2023-07-23 ENCOUNTER — Ambulatory Visit (INDEPENDENT_AMBULATORY_CARE_PROVIDER_SITE_OTHER): Payer: Medicare Other | Admitting: Internal Medicine

## 2023-07-23 ENCOUNTER — Encounter: Payer: Self-pay | Admitting: Internal Medicine

## 2023-07-23 VITALS — BP 132/84 | HR 51 | Temp 97.8°F | Resp 16 | Ht 70.0 in | Wt 156.0 lb

## 2023-07-23 DIAGNOSIS — I2510 Atherosclerotic heart disease of native coronary artery without angina pectoris: Secondary | ICD-10-CM

## 2023-07-23 DIAGNOSIS — I10 Essential (primary) hypertension: Secondary | ICD-10-CM

## 2023-07-23 DIAGNOSIS — Z23 Encounter for immunization: Secondary | ICD-10-CM

## 2023-07-23 DIAGNOSIS — E782 Mixed hyperlipidemia: Secondary | ICD-10-CM | POA: Diagnosis not present

## 2023-07-23 LAB — CBC WITH DIFFERENTIAL/PLATELET
Basophils Absolute: 0 10*3/uL (ref 0.0–0.1)
Basophils Relative: 0.9 % (ref 0.0–3.0)
Eosinophils Absolute: 0.2 10*3/uL (ref 0.0–0.7)
Eosinophils Relative: 3.2 % (ref 0.0–5.0)
HCT: 42.2 % (ref 39.0–52.0)
Hemoglobin: 14 g/dL (ref 13.0–17.0)
Lymphocytes Relative: 18.3 % (ref 12.0–46.0)
Lymphs Abs: 1 10*3/uL (ref 0.7–4.0)
MCHC: 33.2 g/dL (ref 30.0–36.0)
MCV: 94.6 fL (ref 78.0–100.0)
Monocytes Absolute: 0.4 10*3/uL (ref 0.1–1.0)
Monocytes Relative: 8.1 % (ref 3.0–12.0)
Neutro Abs: 3.8 10*3/uL (ref 1.4–7.7)
Neutrophils Relative %: 69.5 % (ref 43.0–77.0)
Platelets: 237 10*3/uL (ref 150.0–400.0)
RBC: 4.46 Mil/uL (ref 4.22–5.81)
RDW: 13.1 % (ref 11.5–15.5)
WBC: 5.4 10*3/uL (ref 4.0–10.5)

## 2023-07-23 LAB — BASIC METABOLIC PANEL
BUN: 14 mg/dL (ref 6–23)
CO2: 28 meq/L (ref 19–32)
Calcium: 9.4 mg/dL (ref 8.4–10.5)
Chloride: 106 meq/L (ref 96–112)
Creatinine, Ser: 1.12 mg/dL (ref 0.40–1.50)
GFR: 67.8 mL/min (ref >60.00)
Glucose, Bld: 94 mg/dL (ref 70–99)
Potassium: 4.6 meq/L (ref 3.5–5.1)
Sodium: 140 meq/L (ref 135–145)

## 2023-07-23 MED ORDER — ATENOLOL 50 MG PO TABS: 50.0000 mg | ORAL_TABLET | Freq: Every day | ORAL | Status: DC

## 2023-07-23 NOTE — Progress Notes (Signed)
Subjective:    Patient ID: Seth Baker, male    DOB: [birth year], 67 y.o.   MRN: [id number]  DOS:  07/23/2023 Type of visit - description: f/u  Since the last office visit is feeling well. Good compliance with medication. Denies chest pain or difficulty breathing. No edema, no palpitations.  Review of Systems See above   Past Medical History:  Diagnosis Date   Annual physical exam 06/09/2011   HTN (hypertension) 01/13/2007   Qualifier: Diagnosis of  By: Blossom Hoops MD, Luis     Hyperlipidemia    on meds   HYPERLIPIDEMIA 04/22/2007   Annotation: Previously declined therapy Qualifier: Diagnosis of  By: Blossom Hoops MD, Luis     Hypertension    on meds   SHOULDER PAIN, LEFT 01/31/2008   Centricity Description: SHOULDER PAIN, LEFT Qualifier: Diagnosis of  By: Drue Novel MD, Nolon Rod.  Centricity Description: SHOULDER PAIN, RIGHT Qualifier: Diagnosis of  By: Drue Novel MD, Charline Hoskinson E.    SKIN LESION 11/29/2009   Qualifier: Diagnosis of  By: Drue Novel MD, Nolon Rod.    Tubular adenoma of colon 09/26/2014    Past Surgical History:  Procedure Laterality Date   COLONOSCOPY  08/2014   JMP- 5 yr recall   LAPAROSCOPIC INGUINAL HERNIA REPAIR Right    LAPAROSCOPIC INGUINAL HERNIA REPAIR Left    NASAL SINUS SURGERY     in the 1980's in [place]   Perianal lesion removal  2015   Dr. Michaell Cowing   ROTATOR CUFF REPAIR Left 2018    Current Outpatient Medications  Medication Instructions   amLODipine (NORVASC) 5 mg, Oral, Daily   aspirin 81 mg, Daily   atenolol (TENORMIN) 25 mg, Oral, 2 times daily   ezetimibe (ZETIA) 10 mg, Oral, Daily   Flaxseed, Linseed, (FLAXSEED OIL PO) 1 tablet, Oral, 3 times daily, Unknown strength   fluticasone (FLONASE) 50 MCG/ACT nasal spray 1 spray, Each Nare, 2 times daily   ramipril (ALTACE) 10 mg, Oral, Daily   rosuvastatin (CRESTOR) 10 mg, Oral, Daily       Objective:   Physical Exam BP 132/84   Pulse (!) 51   Temp 97.8 F (36.6 C) (Oral)   Resp 16   Ht 5\' 10"  (1.778 m)    Wt 156 lb (70.8 kg)   SpO2 98%   BMI 22.38 kg/m  General:   Well developed, NAD, BMI noted. HEENT:  Normocephalic . Face symmetric, atraumatic Lungs:  CTA B Normal respiratory effort, no intercostal retractions, no accessory muscle use. Heart: RRR,  no murmur.  Lower extremities: no pretibial edema bilaterally  Skin: Not pale. Not jaundice Neurologic:  alert & oriented X3.  Speech normal, gait appropriate for age and unassisted Psych--  Cognition and judgment appear intact.  Cooperative with normal attention span and concentration.  Behavior appropriate. No anxious or depressed appearing.      Assessment    Assessment  HTN Hyperlipidemia 06-2014 carotid US  (1-39%), October 2019 and December 2021: 1-39% stable.  CAD---Coronary calcium score: Elevated, saw cardiology 03-2021.  Plan: CV RF control  PLAN: HTN: BP looks good today, at home is in the 130s.  On amlodipine, Altace, atenolol 50 mg 1/2 tab bid, has a hard time splitting atenolol so we will change to 1 tablet in the morning. Check BMP and CBC High cholesterol: Last LDL 46, great control.  Continue Crestor and Zetia CAD: Per elevated coronary calcium score, saw cardiology a year ago, has been unable to get a f/u w/them, he is  ASX.  Recommend to continue current meds and see cardiology again if symptomatic. Vaccine advice provided. RTC 6 months

## 2023-07-23 NOTE — Patient Instructions (Addendum)
Vaccines I recommend: Tdap (tetanus) RSV vaccine COVID booster   Check the  blood pressure regularly Blood pressure goal:  between 110/65 and  135/85. If it is consistently higher or lower, let me know     GO TO THE LAB : Get the blood work     Next visit with me in 6 months for checkup. Please schedule it at the front desk

## 2023-07-23 NOTE — Assessment & Plan Note (Signed)
HTN: BP looks good today, at home is in the 130s.  On amlodipine, Altace, atenolol 50 mg 1/2 tab bid, has a hard time splitting atenolol so we will change to 1 tablet in the morning. Check BMP and CBC High cholesterol: Last LDL 46, great control.  Continue Crestor and Zetia CAD: Per elevated coronary calcium score, saw cardiology a year ago, has been unable to get a f/u w/them, he is ASX.  Recommend to continue current meds and see cardiology again if symptomatic. Vaccine advice provided. RTC 6 months

## 2023-08-03 ENCOUNTER — Other Ambulatory Visit: Payer: Self-pay | Admitting: Internal Medicine

## 2023-08-04 ENCOUNTER — Encounter: Payer: Self-pay | Admitting: Internal Medicine

## 2023-08-23 ENCOUNTER — Other Ambulatory Visit: Payer: Self-pay | Admitting: Internal Medicine

## 2023-11-08 ENCOUNTER — Other Ambulatory Visit: Payer: Self-pay | Admitting: Internal Medicine

## 2023-11-08 MED ORDER — AMLODIPINE BESYLATE 5 MG PO TABS: 5.0000 mg | ORAL_TABLET | Freq: Every day | ORAL | 1 refills | Status: DC

## 2023-11-08 NOTE — Telephone Encounter (Signed)
Copied from CRM [id number]. Topic: Clinical - Medication Refill >> Nov 08, 2023 10:40 AM Pascal Lux wrote: Most Recent Primary Care Visit:  Provider: Wanda Plump  Department: [facility]  Visit Type: OFFICE VISIT  Date: 07/23/2023  Medication: amLODipine (NORVASC) 5 MG tablet [[id number]]  Has the patient contacted their pharmacy? Yes (Agent: If no, request that the patient contact the pharmacy for the refill. If patient does not wish to contact the pharmacy document the reason why and proceed with request.) (Agent: If yes, when and what did the pharmacy advise?) [facility] and [facility] advised to [facility] us.  Is this the correct pharmacy for this prescription? Yes If no, delete pharmacy and type the correct one.  This is the patient's preferred pharmacy:  [id number] [place], [place] - [address] [place] [address] Phone: [phone] Fax: [phone]   Has the prescription been filled recently? No  Is the patient out of the medication? Yes  Has the patient been seen for an appointment in the last year OR does the patient have an upcoming appointment? Yes  Can we respond through MyChart? No  Agent: Please be advised that Rx refills may take up to 3 business days. We ask that you follow-up with your pharmacy.

## 2023-12-24 ENCOUNTER — Other Ambulatory Visit: Payer: Self-pay | Admitting: Internal Medicine

## 2023-12-24 MED ORDER — ROSUVASTATIN CALCIUM 10 MG PO TABS: 10.0000 mg | ORAL_TABLET | Freq: Every day | ORAL | 1 refills | Status: DC

## 2023-12-24 MED ORDER — ATENOLOL 50 MG PO TABS: 50.0000 mg | ORAL_TABLET | Freq: Every day | ORAL | 1 refills | Status: DC

## 2023-12-24 NOTE — Telephone Encounter (Signed)
 Copied from CRM [id number]. Topic: Clinical - Medication Refill >> Dec 24, 2023 11:01 AM Elizebeth Brooking wrote: Most Recent Primary Care Visit:  Provider: Willow Ora E  Department: [facility]  Visit Type: OFFICE VISIT  Date: 07/23/2023  Medication: rosuvastatin (CRESTOR) 10 MG tablet atenolol (TENORMIN) 50 MG tablet  Has the patient contacted their pharmacy? Yes (Agent: If no, request that the patient contact the pharmacy for the refill. If patient does not wish to contact the pharmacy document the reason why and proceed with request.) (Agent: If yes, when and what did the pharmacy advise?)  Is this the correct pharmacy for this prescription? Yes If no, delete pharmacy and type the correct one.  This is the patient's preferred pharmacy:  [id number] - [place], [place] - [address] [place] [address] Phone: [phone] Fax: [phone]   Has the prescription been filled recently? No  Is the patient out of the medication? Yes  Has the patient been seen for an appointment in the last year OR does the patient have an upcoming appointment? Yes  Can we respond through MyChart? Yes  Agent: Please be advised that Rx refills may take up to 3 business days. We ask that you follow-up with your pharmacy.

## 2023-12-28 ENCOUNTER — Ambulatory Visit (INDEPENDENT_AMBULATORY_CARE_PROVIDER_SITE_OTHER): Admitting: Internal Medicine

## 2023-12-28 ENCOUNTER — Encounter: Payer: Self-pay | Admitting: Internal Medicine

## 2023-12-28 VITALS — BP 124/80 | HR 49 | Temp 97.8°F | Resp 16 | Ht 70.0 in | Wt 154.2 lb

## 2023-12-28 DIAGNOSIS — I10 Essential (primary) hypertension: Secondary | ICD-10-CM

## 2023-12-28 DIAGNOSIS — R739 Hyperglycemia, unspecified: Secondary | ICD-10-CM

## 2023-12-28 DIAGNOSIS — E782 Mixed hyperlipidemia: Secondary | ICD-10-CM

## 2023-12-28 DIAGNOSIS — R399 Unspecified symptoms and signs involving the genitourinary system: Secondary | ICD-10-CM | POA: Diagnosis not present

## 2023-12-28 DIAGNOSIS — I2510 Atherosclerotic heart disease of native coronary artery without angina pectoris: Secondary | ICD-10-CM

## 2023-12-28 DIAGNOSIS — R972 Elevated prostate specific antigen [PSA]: Secondary | ICD-10-CM

## 2023-12-28 LAB — AST: AST: 21 U/L (ref 0–37)

## 2023-12-28 LAB — ALT: ALT: 20 U/L (ref 0–53)

## 2023-12-28 LAB — BASIC METABOLIC PANEL WITH GFR
BUN: 18 mg/dL (ref 6–23)
CO2: 26 meq/L (ref 19–32)
Calcium: 9.6 mg/dL (ref 8.4–10.5)
Chloride: 104 meq/L (ref 96–112)
Creatinine, Ser: 1.13 mg/dL (ref 0.40–1.50)
GFR: 66.87 mL/min (ref >60.00)
Glucose, Bld: 101 mg/dL — ABNORMAL HIGH (ref 70–99)
Potassium: 4.2 meq/L (ref 3.5–5.1)
Sodium: 138 meq/L (ref 135–145)

## 2023-12-28 LAB — LIPID PANEL
Cholesterol: 108 mg/dL (ref 0–200)
HDL: 40.5 mg/dL (ref >39.00)
LDL Cholesterol: 47 mg/dL (ref 0–99)
NonHDL: 67.15
Total CHOL/HDL Ratio: 3
Triglycerides: 101 mg/dL (ref 0.0–149.0)
VLDL: 20.2 mg/dL (ref 0.0–40.0)

## 2023-12-28 LAB — PSA: PSA: 3.9 ng/mL (ref 0.10–4.00)

## 2023-12-28 LAB — TSH: TSH: 3.03 u[IU]/mL (ref 0.35–5.50)

## 2023-12-28 MED ORDER — ATENOLOL 50 MG PO TABS: 50.0000 mg | ORAL_TABLET | Freq: Every day | ORAL | 1 refills | Status: DC

## 2023-12-28 NOTE — Progress Notes (Signed)
 Subjective:    Patient ID: Seth Baker, male    DOB: March 08, 1956, 68 y.o.   MRN: [id number]  DOS:  12/28/2023 Type of visit - description: Follow-up  Here for checkup. Doing well. Good med compliance. He is trying to be more active, playing disc golf.  Denies chest pain or difficulty breathing.  No GI symptoms. Nocturia x 1 without dysuria or gross hematuria.  Urinary stream is slightly low.   Wt Readings from Last 3 Encounters:  12/28/23 154 lb 4 oz (70 kg)  07/23/23 156 lb (70.8 kg)  01/20/23 154 lb 2 oz (69.9 kg)     Review of Systems See above   Past Medical History:  Diagnosis Date   Annual physical exam 06/09/2011   HTN (hypertension) 01/13/2007   Qualifier: Diagnosis of  By: Blossom Hoops MD, Luis     Hyperlipidemia    on meds   HYPERLIPIDEMIA 04/22/2007   Annotation: Previously declined therapy Qualifier: Diagnosis of  By: Blossom Hoops MD, Luis     Hypertension    on meds   SHOULDER PAIN, LEFT 01/31/2008   Centricity Description: SHOULDER PAIN, LEFT Qualifier: Diagnosis of  By: Drue Novel MD, Nolon Rod.  Centricity Description: SHOULDER PAIN, RIGHT Qualifier: Diagnosis of  By: Drue Novel MD, Dayyan Krist E.    SKIN LESION 11/29/2009   Qualifier: Diagnosis of  By: Drue Novel MD, Nolon Rod.    Tubular adenoma of colon 09/26/2014    Past Surgical History:  Procedure Laterality Date   COLONOSCOPY  08/2014   JMP- 5 yr recall   LAPAROSCOPIC INGUINAL HERNIA REPAIR Right    LAPAROSCOPIC INGUINAL HERNIA REPAIR Left    NASAL SINUS SURGERY     in the 1980's in [place]   Perianal lesion removal  2015   Dr. Michaell Cowing   ROTATOR CUFF REPAIR Left 2018    Current Outpatient Medications  Medication Instructions   amLODipine (NORVASC) 5 mg, Oral, Daily   aspirin 81 mg, Daily   atenolol (TENORMIN) 50 mg, Oral, Daily   ezetimibe (ZETIA) 10 mg, Oral, Daily   Flaxseed, Linseed, (FLAXSEED OIL PO) 1 tablet, 3 times daily   fluticasone (FLONASE) 50 MCG/ACT nasal spray 1 spray, 2 times daily   ramipril  (ALTACE) 10 mg, Oral, Daily   rosuvastatin (CRESTOR) 10 mg, Oral, Daily       Objective:   Physical Exam BP 124/80   Pulse (!) 49   Temp 97.8 F (36.6 C) (Oral)   Resp 16   Ht 5\' 10"  (1.778 m)   Wt 154 lb 4 oz (70 kg)   SpO2 96%   BMI 22.13 kg/m  General: Well developed, NAD, BMI noted Neck: No  thyromegaly  HEENT:  Normocephalic . Face symmetric, atraumatic Lungs:  CTA B Normal respiratory effort, no intercostal retractions, no accessory muscle use. Heart: RRR,  no murmur.  Abdomen:  Not distended, soft, non-tender. No rebound or rigidity.   Lower extremities: no pretibial edema bilaterally  Skin: Exposed areas without rash. Not pale. Not jaundice Neurologic:  alert & oriented X3.  Speech normal, gait appropriate for age and unassisted Strength symmetric and appropriate for age.  Psych: Cognition and judgment appear intact.  Cooperative with normal attention span and concentration.  Behavior appropriate. No anxious or depressed appearing.     Assessment     Assessment  HTN Hyperlipidemia 06-2014 carotid US  (1-39%), October 2019 and December 2021: 1-39% stable.  CAD---Coronary calcium score: Elevated, saw cardiology 03-2021.  Plan: CV RF control  PLAN:  HTN: Ambulatory BPs in the 120, 130.  Heart rate is slightly low but he is asymptomatic. Refill atenolol, continue amlodipine, Altace, atenolol.  Checking a BMP. Hyperlipidemia: On Zetia and rosuvastatin.  Check FLP AST ALT. CAD: Per coronary calcium score.  Asymptomatic.  Plan is to control CV risk factors. Dermatology: Recently saw dermatology, had 1 placed biopsy. Preventive care reviewed:  - Tdap 2024 -  S/p shingrix - PNM 20: 2022 -Vaccines I recommend: Flu shot every fall, COVID booster. -CCS: virtual colonoscopy 5-07: neg; Colonoscopy  2015; cscope 06-2021 next  07/2024 per GI letter, pt aware  -Prostate ca screening: Check a PSA, other than  nocturia x 1, no symptoms -Healthcare POA: Information  provided  RTC 6 months

## 2023-12-28 NOTE — Assessment & Plan Note (Signed)
 Preventive care reviewed:  - Tdap 2024 -  S/p shingrix - PNM 20: 2022 -Vaccines I recommend: Flu shot every fall, COVID booster. -CCS: virtual colonoscopy 5-07: neg; Colonoscopy  2015; cscope 06-2021 next  07/2024 per GI letter, pt aware  -Prostate ca screening: Check a PSA, other than  nocturia x 1, no symptoms -Healthcare POA: Information provided

## 2023-12-28 NOTE — Assessment & Plan Note (Signed)
 HTN: Ambulatory BPs in the 120, 130.  Heart rate is slightly low but he is asymptomatic. Refill atenolol, continue amlodipine, Altace, atenolol.  Checking a BMP. Hyperlipidemia: On Zetia and rosuvastatin.  Check FLP AST ALT. CAD: Per coronary calcium score.  Asymptomatic.  Plan is to control CV risk factors. Dermatology: Recently saw dermatology, had 1 placed biopsy. Preventive care reviewed  RTC 6 months

## 2023-12-28 NOTE — Patient Instructions (Signed)
 Your next  colonoscopy is due November 2025  Vaccines I recommend: Flu shot every fall Consider COVID booster   Continue checking your blood pressure regularly Blood pressure goal:  between 110/65 and  135/85. If it is consistently higher or lower, let me know     GO TO THE LAB : Get the blood work     Please go to the front desk: Arrange for a follow-up in 6 months    "Health Care Power of attorney" (Also know as a  "Living will" or  Advance care planning documents)  If you already have a living will or healthcare power of attorney, is recommended you bring the copy to be scanned in your chart.   The document will be available to all the doctors you see in the system.  If you are over 77 y/o and don't have the document, please read:  Advance care planning is a process that supports adults in  understanding and sharing their preferences regarding future medical care.  The patient's preferences are recorded in documents called Advance Directives and the can be modified at any time while the patient is in full mental capacity.     More information at: [email or web address]

## 2023-12-30 ENCOUNTER — Encounter: Payer: Self-pay | Admitting: Internal Medicine

## 2024-01-21 ENCOUNTER — Ambulatory Visit: Payer: Medicare Other | Admitting: Internal Medicine

## 2024-01-24 ENCOUNTER — Ambulatory Visit: Payer: Self-pay | Admitting: *Deleted

## 2024-01-24 NOTE — Telephone Encounter (Signed)
  Chief Complaint: red rash from tick bite left hip  Symptoms: red rash around site where tiny tick removed from left hip area underware line. Tick also removed from right hip but no rash. Itching no other sx  Frequency: approx 10 days ago  Pertinent Negatives: Patient denies fever no rash all over no headaches Disposition: [] ED /[] Urgent Care (no appt availability in office) / [x] Appointment(In office/virtual)/ []  [facility]/ [] Home Care/ [] Refused Recommended Disposition /[] [facility]/ []  Follow-up with PCP Additional Notes:   No available appt with PCP tomorrow. Unable to make appt today at 3:20 . Scheduled appt for tomorrow with other provider.      Copied from CRM [id number]. Topic: Clinical - Red Word Triage >> Jan 24, 2024  3:06 PM Seth Baker wrote: Red Word that prompted transfer to Nurse Triage: Patient calling in because he said he had a tick bite and now and has a rash that has spread 10 inches on his left hip. Reason for Disposition  Red ring or bull's-eye rash occurs at tick bite  Answer Assessment - Initial Assessment Questions 1. ATTACHED:  "Is the tick still on the skin?"  (e.g., yes, no, unsure)     no 2. ONSET - TICK STILL ATTACHED:  "How long do you think the tick has been on your skin?" (e.g., hours, days, unsure)  Note:  Is there a recent activity (camping, hiking) where the caller may have been exposed?  Maybe 1 day 3. ONSET - TICK NOT STILL ATTACHED: "If the tick has been removed, how long do you think the tick was attached before you removed it?" (e.g., 5 hours, 2 days). "When was this?"     Approx 1 day,  10 days ago  4. LOCATION: "Where is the tick bite located?" (e.g., arm, leg)     Left hip area underware line and one tick removed from right side but no redness 5. TYPE of TICK: "Is it a wood tick or a deer tick?" (e.g., deer tick, wood tick; unsure)     Na  6. SIZE of TICK: "How big is the tick?" (e.g., size of poppy seed, apple seed,  watermelon seed; unsure) Note: Deer ticks can be the size of a poppy seed (nymph) or an apple seed (adult).       tiny 7. ENGORGED: "Did the tick look flat or engorged (full, swollen)?" (e.g., flat, engorged; unsure)     flat 8. OTHER SYMPTOMS: "Do you have any other symptoms?" (e.g., fever, rash, redness at bite area, red ring around bite)     Red rash approx 10 inches around bite area 9. PREGNANCY: "Is there any chance you are pregnant?" "When was your last menstrual period?"     na  Protocols used: Tick Bite-A-AH

## 2024-01-25 ENCOUNTER — Other Ambulatory Visit: Payer: Self-pay | Admitting: Internal Medicine

## 2024-01-25 ENCOUNTER — Ambulatory Visit (INDEPENDENT_AMBULATORY_CARE_PROVIDER_SITE_OTHER): Admitting: Medical

## 2024-01-25 VITALS — BP 137/68 | HR 50 | Temp 97.8°F | Resp 16 | Ht 70.0 in | Wt 156.0 lb

## 2024-01-25 DIAGNOSIS — S20469A Insect bite (nonvenomous) of unspecified back wall of thorax, initial encounter: Secondary | ICD-10-CM

## 2024-01-25 DIAGNOSIS — W57XXXA Bitten or stung by nonvenomous insect and other nonvenomous arthropods, initial encounter: Secondary | ICD-10-CM

## 2024-01-25 MED ORDER — DOXYCYCLINE HYCLATE 100 MG PO TABS: 100.0000 mg | ORAL_TABLET | Freq: Two times a day (BID) | ORAL | 0 refills | Status: DC

## 2024-01-25 MED ORDER — EZETIMIBE 10 MG PO TABS: 10.0000 mg | ORAL_TABLET | Freq: Every day | ORAL | 1 refills | Status: DC

## 2024-01-25 NOTE — Patient Instructions (Signed)
 Tick bites with rash Recent tick bites with rash. Differential includes tick-borne illnesses vs early skin infection particularly on left side.. Doxycycline chosen due to risk and rash presence. Informed consent given for doxycycline use. - Prescribe doxycycline 100 mg BID for 10 days. - Advise taking with food to prevent GI upset. - Instruct to avoid excessive sun exposure. - Advise against calcium -containing products with doxycycline. - Recommend probiotics to reduce chance of c dif. - Instruct to report if rash persists beyond 10 days.

## 2024-01-25 NOTE — Progress Notes (Signed)
 Subjective:    Patient ID: Seth Baker, male    DOB: August 02, 1956, 68 y.o.   MRN: [id number]  HPI Seth Baker is a 68 year old male who presents with tick bites and associated rash.  He recently engaged in outdoor activities such as disc golf and woodwork, during which he sustained tick bites. Approximately ten days ago, he noticed a tick on his left flank above the iliac crest, which was removed by his son using tweezers. A day later, he found another tick on his right hip, which he removed himself.  He developed a rash around the site of the tick bite on his left flank. The rash is characterized by prominent redness around the periphery but does not resemble a classic bullseye. The area was warm to the touch a few days ago but has since improved slightly. He has experienced similar tick bites in the past but never developed a rash.  He mentions two additional bites that appeared more recently, though he is uncertain if these are related to ticks or possibly poison ivy, as he has a yellow lab that may have brought in allergens. The rash itched slightly last night. No pain upon pressing the affected areas, and the rash seems to be improving.  He lives with his son and has a yellow lab.    Review of Systems  Constitutional:  Negative for chills and fever.  Respiratory:  Negative for cough, choking and wheezing.   Cardiovascular:  Negative for chest pain and palpitations.  Gastrointestinal:  Negative for diarrhea and nausea.  Genitourinary:  Negative for enuresis and flank pain.  Musculoskeletal:  Negative for back pain, myalgias and neck stiffness.  Skin:  Positive for rash.  Neurological:  Negative for syncope, facial asymmetry, weakness and numbness.  Hematological:  Negative for adenopathy. Does not bruise/bleed easily.  Psychiatric/Behavioral:  Negative for confusion and dysphoric mood. The patient is not nervous/anxious.     Past Medical History:  Diagnosis Date   Annual  physical exam 06/09/2011   HTN (hypertension) 01/13/2007   Qualifier: Diagnosis of  By: Pierce Brewster MD, Luis     Hyperlipidemia    on meds   HYPERLIPIDEMIA 04/22/2007   Annotation: Previously declined therapy Qualifier: Diagnosis of  By: Pierce Brewster MD, Luis     Hypertension    on meds   SHOULDER PAIN, LEFT 01/31/2008   Centricity Description: SHOULDER PAIN, LEFT Qualifier: Diagnosis of  By: Neomi Banks MD, Anitra Ket.  Centricity Description: SHOULDER PAIN, RIGHT Qualifier: Diagnosis of  By: Neomi Banks MD, Jose E.    SKIN LESION 11/29/2009   Qualifier: Diagnosis of  By: Neomi Banks MD, Anitra Ket.    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  Tobacco Use   Smoking status: Never   Smokeless tobacco: Never  Vaping Use   Vaping status: Never Used  Substance and Sexual Activity   Alcohol use: Yes    Alcohol/week: 3.0 - 4.0 standard drinks of alcohol    Types: 3 - 4 Standard drinks or equivalent per week    Comment: socially    Drug use: No   Sexual activity: Not on file  Other Topics Concern   Not on file  Social History Narrative   divorced 2020, lives w/ older son   Has  3 children ,   Pt is  from [place]       Social Drivers of Corporate investment banker Strain: Not on [employer] Insecurity: Not on file  Transportation Needs: Not on file  Physical Activity: Not on file  Stress: Not on file  Social Connections: Not on file  Intimate Partner Violence: Not on file    Past Surgical History:  Procedure Laterality Date   COLONOSCOPY  08/2014   JMP- 5 yr recall   LAPAROSCOPIC INGUINAL HERNIA REPAIR Right    LAPAROSCOPIC INGUINAL HERNIA REPAIR Left    NASAL SINUS SURGERY     in the 1980's in [place]   Perianal lesion removal  2015   Dr.  Hershell Lose   ROTATOR CUFF REPAIR Left 2018    Family History  Problem Relation Age of Onset   Stroke Mother 66       lives in [place]    CAD Neg Hx    Diabetes Neg Hx    Colon cancer Neg Hx    Prostate cancer Neg Hx    Colon polyps Neg Hx    Rectal cancer Neg Hx    Stomach cancer Neg Hx     Allergies  Allergen Reactions   Atorvastatin     Not sure of reaction   Niacin     Redness & itching    Current Outpatient Medications on File Prior to Visit  Medication Sig Dispense Refill   amLODipine  (NORVASC ) 5 MG tablet Take 1 tablet (5 mg total) by mouth daily. 90 tablet 1   aspirin 81 MG tablet Take 81 mg by mouth daily.     atenolol  (TENORMIN ) 50 MG tablet Take 1 tablet (50 mg total) by mouth daily. 90 tablet 1   ezetimibe  (ZETIA ) 10 MG tablet Take 1 tablet (10 mg total) by mouth daily. 90 tablet 1   Flaxseed, Linseed, (FLAXSEED OIL PO) Take 1 tablet by mouth 3 (three) times daily. Unknown strength     fluticasone (FLONASE) 50 MCG/ACT nasal spray Place 1 spray into both nostrils 2 (two) times daily.     ramipril  (ALTACE ) 10 MG capsule Take 1 capsule (10 mg total) by mouth daily. 90 capsule 1   rosuvastatin  (CRESTOR ) 10 MG tablet Take 1 tablet (10 mg total) by mouth daily. 90 tablet 1   No current facility-administered medications on file prior to visit.    BP 137/68   Pulse (!) 50   Temp 97.8 F (36.6 C) (Oral)   Resp 16   Ht 5\' 10"  (1.778 m)   Wt 156 lb (70.8 kg)   SpO2 100%   BMI 22.38 kg/m        Objective:   Physical Exam  General- No acute distress. Pleasant patient. Neck- Full range of motion, no jvd Lungs- Clear, even and unlabored. Heart- regular rate and rhythm. Neurologic- CNII- XII grossly intact.   Skin- Left thorax/flank- above iliac crest. Small mark where tick was attached with slight pink rash. Redness of periprhery. Not warm to touch or indurated. Rt hip- small tick bite mark. Tick already removed.     Assessment & Plan:   Patient  Instructions  Tick bites with rash Recent tick bites with rash. Differential includes tick-borne illnesses vs early skin infection particularly on left side.. Doxycycline chosen due to risk and rash presence. Informed consent given for doxycycline use. - Prescribe doxycycline 100  mg BID for 10 days. - Advise taking with food to prevent GI upset. - Instruct to avoid excessive sun exposure. - Advise against calcium -containing products with doxycycline. - Recommend probiotics to reduce chance of c dif. - Instruct to report if rash persists beyond 10 days.

## 2024-01-25 NOTE — Telephone Encounter (Signed)
 Copied from CRM [id number]. Topic: Clinical - Medication Refill >> Jan 25, 2024 12:35 PM Alyse July wrote: Most Recent Primary Care Visit:  Provider: Ezell Hollow  Department: [facility]  Visit Type: OFFICE VISIT  Date: 12/28/2023  Medication: ezetimibe  (ZETIA ) 10 MG tablet  Has the patient contacted their pharmacy? Yes (Agent: If no, request that the patient contact the pharmacy for the refill. If patient does not wish to contact the pharmacy document the reason why and proceed with request.) (Agent: If yes, when and what did the pharmacy advise?)  Is this the correct pharmacy for this prescription? Yes If no, delete pharmacy and type the correct one.  This is the patient's preferred pharmacy:  [id number] - [place], [place] - [address] [place] [address] Phone: [phone] Fax: [phone]   Has the prescription been filled recently? No  Is the patient out of the medication? No  Has the patient been seen for an appointment in the last year OR does the patient have an upcoming appointment? Yes  Can we respond through MyChart? Yes  Agent: Please be advised that Rx refills may take up to 3 business days. We ask that you follow-up with your pharmacy.

## 2024-02-14 ENCOUNTER — Other Ambulatory Visit: Payer: Self-pay | Admitting: Internal Medicine

## 2024-03-03 NOTE — Telephone Encounter (Unsigned)
 Copied from CRM [id number]. Topic: Clinical - Medication Refill >> Mar 03, 2024  4:04 PM Cruzita Dopp I wrote: Medication: ramipril  (ALTACE ) 10 MG capsule   Has the patient contacted their pharmacy? Yes, pharmacy stated he needed to get authorization from the dct.  (Agent: If no, request that the patient contact the pharmacy for the refill. If patient does not wish to contact the pharmacy document the reason why and proceed with request.) (Agent: If yes, when and what did the pharmacy advise?)  This is the patient's preferred pharmacy:  [id number] - [place], [place] - [address] [facility] [place] [address] Phone: [phone] Fax: [phone]  Is this the correct pharmacy for this prescription? Yes If no, delete pharmacy and type the correct one.   Has the prescription been filled recently? No  Is the patient out of the medication? No  Has the patient been seen for an appointment in the last year OR does the patient have an upcoming appointment? Yes  Can we respond through MyChart? Yes  Agent: Please be advised that Rx refills may take up to 3 business days. We ask that you follow-up with your pharmacy.

## 2024-03-06 ENCOUNTER — Telehealth: Payer: Self-pay

## 2024-03-06 MED ORDER — RAMIPRIL 10 MG PO CAPS: 10.0000 mg | ORAL_CAPSULE | Freq: Every day | ORAL | 1 refills | Status: DC

## 2024-03-06 NOTE — Telephone Encounter (Signed)
 Rx sent.

## 2024-03-06 NOTE — Telephone Encounter (Signed)
 Copied from CRM [id number]. Topic: Clinical - Medication Refill >> Mar 03, 2024  4:04 PM Seth Baker I wrote: Medication: ramipril  (ALTACE ) 10 MG capsule   Has the patient contacted their pharmacy? Yes, pharmacy stated he needed to get authorization from the dct.  (Agent: If no, request that the patient contact the pharmacy for the refill. If patient does not wish to contact the pharmacy document the reason why and proceed with request.) (Agent: If yes, when and what did the pharmacy advise?)  This is the patient's preferred pharmacy:  [id number] - [place], [place] - [address] [facility] [address] Phone: [phone] Fax: [phone]  Is this the correct pharmacy for this prescription? Yes If no, delete pharmacy and type the correct one.   Has the prescription been filled recently? No  Is the patient out of the medication? No  Has the patient been seen for an appointment in the last year OR does the patient have an upcoming appointment? Yes  Can we respond through MyChart? Yes  Agent: Please be advised that Rx refills may take up to 3 business days. We ask that you follow-up with your pharmacy. >> Mar 06, 2024  1:17 PM Seth Baker wrote: Pt called to check status of refill request, stating tomorrow he will only have 1 pill left.

## 2024-03-31 ENCOUNTER — Ambulatory Visit: Payer: Self-pay

## 2024-03-31 NOTE — Telephone Encounter (Signed)
 FYI Only or Action Required?: FYI only for provider.  Patient was last seen in primary care on 01/25/2024 by Saguier, Edward, PA-C.  Called Nurse Triage reporting Foot Swelling.  Symptoms began few weeks ago.  Interventions attempted: Rest, hydration, or home remedies.  Symptoms are: gradually worsening.  Triage Disposition: PCP in next 3 days Patient/caregiver understands and will follow disposition?: yes  Pt stated that he is not having any calf pain redness or pain to feet/ankles. Advised pt that to call 911 if SOB or chest pain and to go to ED for calf pain if it occurs.  Advised to stay away from added salt or processed food and to rest and elevate feet.              Copied from CRM [id number]. Topic: Clinical - Red Word Triage >> Mar 31, 2024  9:30 AM Willma SAUNDERS wrote: Red Word that prompted transfer to Nurse Triage: Patient has been experiencing swelling in his feet for the last few weeks. Changed his medication times and not sure it that is the cause. Took his medication this morning and noticed they are swelling. Also took a recent trip to [place] and had swelling after the flight. Reason for Disposition  [1] Swelling of both ankles/feet AND [2] large  Answer Assessment - Initial Assessment Questions 1. ONSET: When did the swelling start? (e.g., minutes, hours, days)     Few weeks ago  2. LOCATION: What part of the leg is swollen?  Are both legs swollen or just one leg?     Both feet  3. DEGREE OF SWELLING: How large is the swelling?  - LOCALIZED - Small area of swelling on part of one leg (estimate the size) - WIDESPREAD - Swelling involves a large part of leg (calf, thigh or whole leg) or both legs/feet     Widespread  4. SEVERITY of WIDESPREAD SWELLING (e.g., Edema): How bad is the swelling? - MILD edema - swelling limited to foot and ankle, pitting edema < 1/4 inch deep, rest and elevation eliminate most or all swelling - MODERATE edema - swelling of  lower leg to knee, pitting edema > 1/4 inch deep, rest and elevation only partially reduce swelling - SEVERE edema - swelling extends above knee, facial or hand swelling also present      Mild -131/60 5. REDNESS: Does the swelling look red or infected?     no 6. PAIN: Is there any pain? If so, ask, How bad is it?     no 7. ITCH: Does the swelling itch? If so, ask, How much?     no 8. CAUSE: What do you think caused the swelling?      amlodipine  9. CHRONIC DISEASE: Does your child have kidney, heart or liver disease?     HTN  Protocols used: Leg or Foot Swelling-P-AH

## 2024-04-03 ENCOUNTER — Ambulatory Visit: Payer: Self-pay | Admitting: Medical

## 2024-04-03 ENCOUNTER — Ambulatory Visit (INDEPENDENT_AMBULATORY_CARE_PROVIDER_SITE_OTHER): Admitting: Medical

## 2024-04-03 ENCOUNTER — Encounter: Payer: Self-pay | Admitting: Medical

## 2024-04-03 ENCOUNTER — Ambulatory Visit (HOSPITAL_BASED_OUTPATIENT_CLINIC_OR_DEPARTMENT_OTHER)
Admission: RE | Admit: 2024-04-03 | Discharge: 2024-04-03 | Disposition: A | Discharge status: Home / Self Care | Source: Ambulatory Visit | Attending: Medical | Admitting: Medical

## 2024-04-03 VITALS — BP 139/70 | HR 52 | Temp 97.8°F | Resp 16 | Ht 70.0 in | Wt 158.8 lb

## 2024-04-03 DIAGNOSIS — R600 Localized edema: Secondary | ICD-10-CM | POA: Diagnosis present

## 2024-04-03 DIAGNOSIS — I10 Essential (primary) hypertension: Secondary | ICD-10-CM

## 2024-04-03 LAB — BRAIN NATRIURETIC PEPTIDE: Pro B Natriuretic peptide (BNP): 216 pg/mL — ABNORMAL HIGH (ref 0.0–100.0)

## 2024-04-03 LAB — COMPREHENSIVE METABOLIC PANEL WITH GFR
ALT: 21 U/L (ref 0–53)
AST: 24 U/L (ref 0–37)
Albumin: 4.6 g/dL (ref 3.5–5.2)
Alkaline Phosphatase: 60 U/L (ref 39–117)
BUN: 13 mg/dL (ref 6–23)
CO2: 30 meq/L (ref 19–32)
Calcium: 10.1 mg/dL (ref 8.4–10.5)
Chloride: 104 meq/L (ref 96–112)
Creatinine, Ser: 1.1 mg/dL (ref 0.40–1.50)
GFR: 68.94 mL/min (ref >60.00)
Glucose, Bld: 88 mg/dL (ref 70–99)
Potassium: 4.9 meq/L (ref 3.5–5.1)
Sodium: 140 meq/L (ref 135–145)
Total Bilirubin: 1.2 mg/dL (ref 0.2–1.2)
Total Protein: 7.4 g/dL (ref 6.0–8.3)

## 2024-04-03 NOTE — Progress Notes (Unsigned)
   Subjective:    Patient ID: Seth Baker, male    DOB: February 25, 1956, 68 y.o.   MRN: [id number]  HPI Seth Baker is a 68 year old male with hypertension who presents with swollen feet during a recent trip to [place].  He experienced significant swelling in his feet during a one-week trip to [place], particularly noticeable during the day. He attributes the swelling to a change in his medication schedule due to the time difference, as he typically takes amlodipine  in the evening but took it in the morning while abroad.  He has been on amlodipine  for years and usually takes it in the evening, which he believes may mask the swelling as he is asleep. During the trip, he took it in the morning, and the swelling became apparent during the day. He describes the swelling as 'elephant feet' and notes that it was more pronounced on the right side. Elevating his legs in the evening provided some relief, but the swelling did not completely resolve overnight.  Since returning home three days ago and resuming his usual medication schedule, the swelling has significantly improved. No pain in his calves or behind his knees, and no shortness of breath. The swelling was symmetric during the trip, but he notes that the right side was slightly more swollen than the left.  He also takes atenolol , which he splits into half doses for morning and evening, as advised by a cardiologist. He monitors his blood pressure weekly, with recent readings showing improvement.   Review of Systems     Objective:   Physical Exam  General Mental Status- Alert. General Appearance- Not in acute distress.   Skin General: Color- Normal Color. Moisture- Normal Moisture.  Neck Carotid Arteries- Normal color. Moisture- Normal Moisture. No carotid bruits. No JVD.  Chest and Lung Exam Auscultation: Breath Sounds:-Normal.  Cardiovascular Auscultation:Rythm- Regular. Murmurs & Other Heart Sounds:Auscultation of the heart  reveals- No Murmurs.  Abdomen Inspection:-Inspeection Normal. Palpation/Percussion:Note:No mass. Palpation and Percussion of the abdomen reveal- Non Tender, Non Distended + BS, no rebound or guarding.    Neurologic Cranial Nerve exam:- CN III-XII intact(No nystagmus), symmetric smile. Drift Test:- No drift. Romberg Exam:- Negative.  Heal to Toe Gait exam:-Normal. Finger to Nose:- Normal/Intact Strength:- 5/5 equal and symmetric strength both upper and lower extremities.       Assessment & Plan:   Patient Instructions  Dependent Edema Bilateral lower extremity swelling likely due to prolonged standing, air travel, and amlodipine  use. Resolved significantly after returning home and adjusting medication timing. Differential includes medication side effects, dependent edema, heart failure, or DVT(doubt dvt). - Order chest x-ray and blood work including beta natriuretic peptide. - Monitor for asymmetric swelling, calf pain, or pain behind knees and report if present. - Consider leg ultrasound if symptoms suggest DVT.  Hypertension Blood pressure improved to 139/70 mmHg. Managed with amlodipine  and atenolol . Advised home monitoring and atenolol  dosing adjustment as per cardiologist's advice. - Continue current antihypertensive medications. - Advise home blood pressure monitoring two to three times a week.   Follow-up Follow-up based on chest x-ray and blood work results or regular schedule with primary care physician. - Review chest x-ray and blood work results. - Schedule follow-up based on test results or regular schedule with primary care physician.   Mable Dara, PA-C

## 2024-04-03 NOTE — Patient Instructions (Signed)
 Dependent Edema(none presently but expressed concern and dramatic/severe description while on vacation) Bilateral lower extremity swelling likely due to prolonged standing, air travel, and amlodipine  use. Resolved significantly after returning home and adjusting medication timing. Differential includes medication side effects, dependent edema, heart failure, or DVT(doubt dvt). - Order chest x-ray and blood work including beta natriuretic peptide. - Monitor for asymmetric swelling, calf pain, or pain behind knees and report if present. - Consider leg ultrasound if symptoms suggest DVT.  Hypertension Blood pressure improved to 139/70 mmHg. Managed with amlodipine  and atenolol . Advised home monitoring and atenolol  dosing adjustment as per cardiologist's advice. - Continue current antihypertensive medications. - Advise home blood pressure monitoring two to three times a week.   Follow-up Follow-up based on chest x-ray and blood work results or regular schedule with primary care physician. - Review chest x-ray and blood work results. - Schedule follow-up based on test results or regular schedule with primary care physician.

## 2024-04-23 ENCOUNTER — Other Ambulatory Visit: Payer: Self-pay | Admitting: Internal Medicine

## 2024-06-13 ENCOUNTER — Other Ambulatory Visit: Payer: Self-pay | Admitting: Internal Medicine

## 2024-06-27 ENCOUNTER — Encounter: Payer: Self-pay | Admitting: Internal Medicine

## 2024-06-27 ENCOUNTER — Ambulatory Visit (HOSPITAL_COMMUNITY)
Admission: RE | Admit: 2024-06-27 | Discharge: 2024-06-27 | Disposition: A | Discharge status: Home / Self Care | Source: Ambulatory Visit | Attending: Internal Medicine | Admitting: Internal Medicine

## 2024-06-27 ENCOUNTER — Ambulatory Visit: Admitting: Internal Medicine

## 2024-06-27 VITALS — BP 126/82 | HR 65 | Temp 98.0°F | Resp 16 | Ht 70.0 in | Wt 158.5 lb

## 2024-06-27 DIAGNOSIS — Z1211 Encounter for screening for malignant neoplasm of colon: Secondary | ICD-10-CM

## 2024-06-27 DIAGNOSIS — Z23 Encounter for immunization: Secondary | ICD-10-CM

## 2024-06-27 DIAGNOSIS — R609 Edema, unspecified: Secondary | ICD-10-CM | POA: Diagnosis not present

## 2024-06-27 DIAGNOSIS — R7989 Other specified abnormal findings of blood chemistry: Secondary | ICD-10-CM | POA: Diagnosis present

## 2024-06-27 DIAGNOSIS — I340 Nonrheumatic mitral (valve) insufficiency: Secondary | ICD-10-CM | POA: Insufficient documentation

## 2024-06-27 DIAGNOSIS — I10 Essential (primary) hypertension: Secondary | ICD-10-CM

## 2024-06-27 DIAGNOSIS — E782 Mixed hyperlipidemia: Secondary | ICD-10-CM

## 2024-06-27 LAB — BASIC METABOLIC PANEL WITH GFR
BUN: 15 mg/dL (ref 6–23)
CO2: 26 meq/L (ref 19–32)
Calcium: 9.7 mg/dL (ref 8.4–10.5)
Chloride: 106 meq/L (ref 96–112)
Creatinine, Ser: 1.06 mg/dL (ref 0.40–1.50)
GFR: 71.96 mL/min (ref >60.00)
Glucose, Bld: 95 mg/dL (ref 70–99)
Potassium: 4.3 meq/L (ref 3.5–5.1)
Sodium: 140 meq/L (ref 135–145)

## 2024-06-27 LAB — CBC WITH DIFFERENTIAL/PLATELET
Basophils Absolute: 0 K/uL (ref 0.0–0.1)
Basophils Relative: 0.6 % (ref 0.0–3.0)
Eosinophils Absolute: 0.2 K/uL (ref 0.0–0.7)
Eosinophils Relative: 3 % (ref 0.0–5.0)
HCT: 42.1 % (ref 39.0–52.0)
Hemoglobin: 14.3 g/dL (ref 13.0–17.0)
Lymphocytes Relative: 19.9 % (ref 12.0–46.0)
Lymphs Abs: 1.3 K/uL (ref 0.7–4.0)
MCHC: 34 g/dL (ref 30.0–36.0)
MCV: 92.1 fl (ref 78.0–100.0)
Monocytes Absolute: 0.5 K/uL (ref 0.1–1.0)
Monocytes Relative: 7.5 % (ref 3.0–12.0)
Neutro Abs: 4.5 K/uL (ref 1.4–7.7)
Neutrophils Relative %: 69 % (ref 43.0–77.0)
Platelets: 229 K/uL (ref 150.0–400.0)
RBC: 4.57 Mil/uL (ref 4.22–5.81)
RDW: 13.1 % (ref 11.5–15.5)
WBC: 6.6 K/uL (ref 4.0–10.5)

## 2024-06-27 NOTE — Patient Instructions (Signed)
 GO TO THE LAB :  Get the blood work    Then, go to the front desk for the checkout Please make an appointment make an appointment for a follow-up in 6 months   Call gastroenterology and set up your next colonoscopy. [phone] [phone]  Consider getting COVID booster  Check the  blood pressure regularly Blood pressure goal:  between 110/65 and  135/85. If it is consistently higher or lower, let me know     Please read more detailed instructions below    HYPERTENSION: Your blood pressure is well controlled with your current medications. -We will check your potassium and kidney function with a basic metabolic panel (BMP).  PERIPHERAL EDEMA: You have intermittent swelling in your legs, likely due to your medication amlodipine . -Continue to monitor the swelling. Elevate your legs when possible to help reduce the swelling.  MILD TO MODERATE MITRAL VALVE REGURGITATION: You have mild to moderate mitral valve regurgitation as seen in your 2022 echocardiogram. -We will recheck your echocardiogram to monitor your heart health.  HYPERLIPIDEMIA: Your LDL cholesterol is well controlled with your current medications. -Continue taking rosuvastatin  and Zetia  as prescribed.

## 2024-06-27 NOTE — Assessment & Plan Note (Signed)
 Hypertension Blood pressure controlled with ramipril , atenolol , and amlodipine .  Check a CBC and BMP Peripheral edema  Seen with edema few months ago after a trip to [place].  Edema quickly resolved, no cardiopulmonary symptoms.  BNP slightly elevated. At this point he only has mild edema around the ankles at the end of the day, no DOE, no chest pain. Patient is somewhat concerned about the issue would like further investigation. Due to history of mitral valve regurgitation will check echo otherwise continue present care.    Although amlodipine  may be playing a role on his penile swelling, we decided to continue as it is doing a great job controlling his BP Mild to moderate mitral valve regurgitation See above Hyperlipidemia LDL cholesterol controlled at 47 mg/dL with rosuvastatin  and Zetia . Preventive care: Flu shot today.  Recommend to consider COVID booster.  GI referral, due for a colonoscopy RTC 6 months

## 2024-06-27 NOTE — Progress Notes (Addendum)
 Subjective:    Patient ID: Seth Baker, male    DOB: 1956/05/22, 68 y.o.   MRN: [id number]  DOS:  06/27/2024  Discussed the use of AI scribe software for clinical note transcription with the patient, who gave verbal consent to proceed.  History of Present Illness Follow-up  Bilateral lower extremity edema - Significant bilateral leg swelling developed after a long plane trip to [place] - Swelling improved with leg elevation - Occasional swelling persists, more pronounced in the right leg, especially in the evening - Associated with a sensation of tightness in the lower calf - Swelling was infrequent prior to the trip  Cardiopulmonary symptoms - No chest pain, dyspnea, or palpitations - Remains active, able to walk his dog and play disc golf without shortness of breath  Cardiac history - Echocardiogram in 2022 showed mild mitral valve regurgitation   Antihypertensive and cardiovascular medication use - Takes amlodipine , aspirin, rosuvastatin , Zetia , ramipril , and atenolol     Review of Systems See above   Past Medical History:  Diagnosis Date   Annual physical exam 06/09/2011   HTN (hypertension) 01/13/2007   Qualifier: Diagnosis of  By: Tita MD, Luis     Hyperlipidemia    on meds   HYPERLIPIDEMIA 04/22/2007   Annotation: Previously declined therapy Qualifier: Diagnosis of  By: Tita MD, Luis     Hypertension    on meds   SHOULDER PAIN, LEFT 01/31/2008   Centricity Description: SHOULDER PAIN, LEFT Qualifier: Diagnosis of  By: Amon MD, Aloysius BRAVO.  Centricity Description: SHOULDER PAIN, RIGHT Qualifier: Diagnosis of  By: Amon MD, Yahya Boldman E.    SKIN LESION 11/29/2009   Qualifier: Diagnosis of  By: Amon MD, Aloysius BRAVO.    Tubular adenoma of colon 09/26/2014    Past Surgical History:  Procedure Laterality Date   COLONOSCOPY  08/2014   JMP- 5 yr recall   LAPAROSCOPIC INGUINAL HERNIA REPAIR Right    LAPAROSCOPIC INGUINAL HERNIA REPAIR Left    NASAL SINUS SURGERY      in the 1980's in [place]   Perianal lesion removal  2015   Dr. Sheldon   ROTATOR CUFF REPAIR Left 2018    Current Outpatient Medications  Medication Instructions   amLODipine  (NORVASC ) 5 mg, Oral, Daily   aspirin 81 mg, Daily   atenolol  (TENORMIN ) 50 mg, Oral, Daily   doxycycline  (VIBRA -TABS) 100 mg, Oral, 2 times daily   ezetimibe  (ZETIA ) 10 mg, Oral, Daily   Flaxseed, Linseed, (FLAXSEED OIL PO) 1 tablet, 3 times daily   fluticasone (FLONASE) 50 MCG/ACT nasal spray 1 spray, 2 times daily   ramipril  (ALTACE ) 10 mg, Oral, Daily   rosuvastatin  (CRESTOR ) 10 mg, Oral, Daily       Objective:   Physical Exam BP 126/82   Pulse 65   Temp 98 F (36.7 C) (Oral)   Resp 16   Ht 5' 10 (1.778 m)   Wt 158 lb 8 oz (71.9 kg)   SpO2 98%   BMI 22.74 kg/m  General:   Well developed, NAD, BMI noted. HEENT:  Normocephalic . Face symmetric, atraumatic Lungs:  CTA B Normal respiratory effort, no intercostal retractions, no accessory muscle use. Heart: RRR,  no murmur.  Lower extremities: no pretibial edema bilaterally  Skin: Not pale. Not jaundice Neurologic:  alert & oriented X3.  Speech normal, gait appropriate for age and unassisted Psych--  Cognition and judgment appear intact.  Cooperative with normal attention span and concentration.  Behavior appropriate. No anxious or depressed  appearing.      Assessment   Assessment  HTN Hyperlipidemia 06-2014 carotid US   (1-39%), October 2019 and December 2021: 1-39% stable.  CAD---Coronary calcium  score: Elevated, saw cardiology 03-2021.  Plan: CV RF control  Assessment & Plan Hypertension Blood pressure controlled with ramipril , atenolol , and amlodipine .  Check a CBC and BMP Peripheral edema  Seen with edema few months ago after a trip to [place].  Edema quickly resolved, no cardiopulmonary symptoms.  BNP slightly elevated. At this point he only has mild edema around the ankles at the end of the day, no DOE, no chest  pain. Patient is somewhat concerned about the issue would like further investigation. Due to history of mitral valve regurgitation will check echo otherwise continue present care.    Although amlodipine  may be playing a role on his penile swelling, we decided to continue as it is doing a great job controlling his BP Mild to moderate mitral valve regurgitation See above Hyperlipidemia LDL cholesterol controlled at 47 mg/dL with rosuvastatin  and Zetia . Preventive care: Flu shot today.  Recommend to consider COVID booster.  GI referral, due for a colonoscopy RTC 6 months

## 2024-06-28 ENCOUNTER — Ambulatory Visit: Payer: Self-pay | Admitting: Internal Medicine

## 2024-06-28 DIAGNOSIS — I340 Nonrheumatic mitral (valve) insufficiency: Secondary | ICD-10-CM

## 2024-06-28 LAB — ECHOCARDIOGRAM COMPLETE
Height: 70 in
MV M vel: 5.73 m/s
MV Peak grad: 131.3 mmHg
Radius: 0.75 cm
S' Lateral: 2.6 cm
Weight: 2536 [oz_av]

## 2024-07-11 ENCOUNTER — Ambulatory Visit

## 2024-07-11 ENCOUNTER — Ambulatory Visit: Attending: Cardiology | Admitting: Cardiology

## 2024-07-11 ENCOUNTER — Encounter: Payer: Self-pay | Admitting: Cardiology

## 2024-07-11 VITALS — BP 140/78 | HR 51 | Ht 70.0 in | Wt 156.0 lb

## 2024-07-11 DIAGNOSIS — I340 Nonrheumatic mitral (valve) insufficiency: Secondary | ICD-10-CM | POA: Diagnosis not present

## 2024-07-11 DIAGNOSIS — E782 Mixed hyperlipidemia: Secondary | ICD-10-CM | POA: Diagnosis present

## 2024-07-11 DIAGNOSIS — R0609 Other forms of dyspnea: Secondary | ICD-10-CM | POA: Insufficient documentation

## 2024-07-11 DIAGNOSIS — I10 Essential (primary) hypertension: Secondary | ICD-10-CM | POA: Insufficient documentation

## 2024-07-11 DIAGNOSIS — R002 Palpitations: Secondary | ICD-10-CM | POA: Diagnosis present

## 2024-07-11 DIAGNOSIS — I2510 Atherosclerotic heart disease of native coronary artery without angina pectoris: Secondary | ICD-10-CM | POA: Insufficient documentation

## 2024-07-11 NOTE — Progress Notes (Unsigned)
 Cardiology Office Note:    Date:  07/11/2024   ID:  Gleen, Ripberger 20-Feb-1956, MRN [id number]  PCP:  Amon Aloysius BRAVO, MD  Cardiologist:  Lamar Fitch, MD    Referring MD: Amon Aloysius BRAVO, MD   No chief complaint on file. I have swollen legs and abnormal echocardiogram  History of Present Illness:    Toussaint P Burggraf is a 68 y.o. male past medical history significant for elevated calcium  score 540, completely asymptomatic, essential hypertension appropriately managed, I have not seen him since 2023.  Comes today to my office.  Apparently couple months ago he went to [place] to his country when he went there his legs were significantly swollen.  He ended up having echocardiogram done after that echocardiogram showed preserved ejection fraction, however, he did have significant enlarged left atrium mild to moderate mitral regurgitation.  He was referred back to me.  Since that time there is no more swelling of lower extremities.  He can walk climb stairs does what ever he wants to do without any chest pain tightness squeezing burning in the chest, overall he is asymptomatic  Past Medical History:  Diagnosis Date   Annual physical exam 06/09/2011   HTN (hypertension) 01/13/2007   Qualifier: Diagnosis of  By: Tita MD, Luis     Hyperlipidemia    on meds   HYPERLIPIDEMIA 04/22/2007   Annotation: Previously declined therapy Qualifier: Diagnosis of  By: Tita MD, Luis     Hypertension    on meds   SHOULDER PAIN, LEFT 01/31/2008   Centricity Description: SHOULDER PAIN, LEFT Qualifier: Diagnosis of  By: Amon MD, Aloysius BRAVO.  Centricity Description: SHOULDER PAIN, RIGHT Qualifier: Diagnosis of  By: Amon MD, Jose E.    SKIN LESION 11/29/2009   Qualifier: Diagnosis of  By: Amon MD, Aloysius BRAVO.    Tubular adenoma of colon 09/26/2014    Past Surgical History:  Procedure Laterality Date   COLONOSCOPY  08/2014   JMP- 5 yr recall   LAPAROSCOPIC INGUINAL HERNIA REPAIR Right    LAPAROSCOPIC  INGUINAL HERNIA REPAIR Left    NASAL SINUS SURGERY     in the 1980's in [place]   Perianal lesion removal  2015   Dr. Sheldon   ROTATOR CUFF REPAIR Left 2018    Current Medications: Current Meds  Medication Sig   amLODipine  (NORVASC ) 5 MG tablet Take 1 tablet (5 mg total) by mouth daily.   aspirin 81 MG tablet Take 81 mg by mouth daily.   atenolol  (TENORMIN ) 50 MG tablet TAKE 1 TABLET BY MOUTH EVERY DAY   ezetimibe  (ZETIA ) 10 MG tablet Take 1 tablet (10 mg total) by mouth daily.   Flaxseed, Linseed, (FLAXSEED OIL PO) Take 1 tablet by mouth 3 (three) times daily. Unknown strength   ramipril  (ALTACE ) 10 MG capsule Take 1 capsule (10 mg total) by mouth daily.   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  Tobacco Use   Smoking status: Never   Smokeless tobacco: Never  Vaping Use   Vaping status: Never Used  Substance and Sexual Activity   Alcohol  use: Yes    Alcohol/week: 3.0 - 4.0 standard drinks of alcohol    Types: 3 - 4 Standard drinks or equivalent per week    Comment: socially    Drug use: No   Sexual activity: Not on file  Other Topics Concern   Not on file  Social History Narrative   divorced 2020, lives w/ older son   Has  3 children ,   Pt is  from [place]       Social Drivers of Corporate investment banker Strain: Not on file  Food Insecurity: Not on file  Transportation Needs: Not on file  Physical Activity: Not on file  Stress: Not on file  Social Connections: Not on file     Family History: The patient's family history includes Stroke (age of onset: 70) in his mother. There is no history of CAD, Diabetes, Colon cancer,  Prostate cancer, Colon polyps, Rectal cancer, or Stomach cancer. ROS:   Please see the history of present illness.    All 14 point review of systems negative except as described per history of present illness  EKGs/Labs/Other Studies Reviewed:    EKG Interpretation Date/Time:  Tuesday July 11 2024 09:47:01 EDT Ventricular Rate:  51 PR Interval:  152 QRS Duration:  80 QT Interval:  436 QTC Calculation: 401 R Axis:   54  Text Interpretation: Sinus bradycardia No previous ECGs available Confirmed by Bernie Charleston [phone]) on 07/11/2024 9:50:35 AM    Recent Labs: 12/28/2023: TSH 3.03 04/03/2024: ALT 21; Pro B Natriuretic peptide (BNP) 216.0 06/27/2024: BUN 15; Creatinine, Ser 1.06; Hemoglobin 14.3; Platelets 229.0; Potassium 4.3; Sodium 140  Recent Lipid Panel    Component Value Date/Time   CHOL 108 12/28/2023 0813   CHOL 109 12/02/2021 0913   TRIG 101.0 12/28/2023 0813   TRIG 113 09/06/2006 0828   HDL 40.50 12/28/2023 0813   HDL 46 12/02/2021 0913   CHOLHDL 3 12/28/2023 0813   VLDL 20.2 12/28/2023 0813   LDLCALC 47 12/28/2023 0813   LDLCALC 46 12/02/2021 0913   LDLCALC 140 (H) 07/09/2020 0945   LDLDIRECT 143.4 04/18/2007 0818    Physical Exam:    VS:  BP (!) 140/78   Pulse (!) 51   Ht 5' 10 (1.778 m)   Wt 156 lb (70.8 kg)   SpO2 97%   BMI 22.38 kg/m     Wt Readings from Last 3 Encounters:  07/11/24 156 lb (70.8 kg)  06/27/24 158 lb 8 oz (71.9 kg)  04/03/24 158 lb 12.8 oz (72 kg)     GEN:  Well nourished, well developed in no acute distress HEENT: Normal NECK: No JVD; No carotid bruits LYMPHATICS: No lymphadenopathy CARDIAC: RRR, no murmurs, no rubs, no gallops RESPIRATORY:  Clear to auscultation without rales, wheezing or rhonchi  ABDOMEN: Soft, non-tender, non-distended MUSCULOSKELETAL:  No edema; No deformity  SKIN: Warm and dry LOWER EXTREMITIES: no swelling NEUROLOGIC:  Alert and oriented x 3 PSYCHIATRIC:  Normal affect   ASSESSMENT:    1.  Nonrheumatic mitral valve regurgitation   2. Coronary artery disease involving native coronary artery of native heart without angina pectoris   3. Primary hypertension   4. Mixed hyperlipidemia   5. Palpitations    PLAN:    In order of problems listed above:  Nonrheumatic mitral valve regurgitation mild to moderate, what is unusual is degree of left atrium enlargement that he has there is no good explanation for it.  I think this degree of  mitral digitation should not create such a significant large from the atrium.  Concern is about potentially having atrial fibrillation.  Will schedule him to have Zio patch to make sure he does not have any significant paroxysmal atrial fibrillation, will also do proBNP Chem-7. Coronary disease with calcification completely asymptomatic he is on antiplatelet therapy as well as statin which I will continue. Dyslipidemia I did review KPN which show me LDL of 647 HDL of 40 he is on Crestor  10 and Zetia  10 will continue present management. Essential hypertension blood pressure well-controlled   Medication Adjustments/Labs and Tests Ordered: Current medicines are reviewed at length with the patient today.  Concerns regarding medicines are outlined above.  Orders Placed This Encounter  Procedures   EKG 12-Lead   Medication changes: No orders of the defined types were placed in this encounter.   Signed, Lamar DOROTHA Fitch, MD, [facility] 07/11/2024 10:10 AM    [facility]

## 2024-07-11 NOTE — Patient Instructions (Addendum)
 Medication Instructions:  Your physician recommends that you continue on your current medications as directed. Please refer to the Current Medication list given to you today.  *If you need a refill on your cardiac medications before your next appointment, please call your pharmacy*   Lab Work: BMP, ProBNP-today If you have labs (blood work) drawn today and your tests are completely normal, you will receive your results only by: MyChart Message (if you have MyChart) OR A paper copy in the mail If you have any lab test that is abnormal or we need to change your treatment, we will call you to review the results.   Testing/Procedures:  WHY IS MY DOCTOR PRESCRIBING ZIO? The Zio system is proven and trusted by physicians to detect and diagnose irregular heart rhythms -- and has been prescribed to hundreds of thousands of patients.  The FDA has cleared the Zio system to monitor for many different kinds of irregular heart rhythms. In a study, physicians were able to reach a diagnosis 90% of the time with the Zio system1.  You can wear the Zio monitor -- a small, discreet, comfortable patch -- during your normal day-to-day activity, including while you sleep, shower, and exercise, while it records every single heartbeat for analysis.  1Barrett, P., et al. Comparison of 24 Hour Holter Monitoring Versus 14 Day Novel Adhesive Patch Electrocardiographic Monitoring. American Journal of Medicine, 2014.  ZIO VS. HOLTER MONITORING The Zio monitor can be comfortably worn for up to 14 days. Holter monitors can be worn for 24 to 48 hours, limiting the time to record any irregular heart rhythms you may have. Zio is able to capture data for the 51% of patients who have their first symptom-triggered arrhythmia after 48 hours.1  LIVE WITHOUT RESTRICTIONS The Zio ambulatory cardiac monitor is a small, unobtrusive, and water-resistant patch--you might even forget you're wearing it. The Zio monitor records and  stores every beat of your heart, whether you're sleeping, working out, or showering.     Follow-Up: At [facility], you and your health needs are our priority.  As part of our continuing mission to provide you with exceptional heart care, we have created designated Provider Care Teams.  These Care Teams include your primary Cardiologist (physician) and Advanced Practice Providers (APPs -  Physician Assistants and Nurse Practitioners) who all work together to provide you with the care you need, when you need it.  We recommend signing up for the patient portal called MyChart.  Sign up information is provided on this After Visit Summary.  MyChart is used to connect with patients for Virtual Visits (Telemedicine).  Patients are able to view lab/test results, encounter notes, upcoming appointments, etc.  Non-urgent messages can be sent to your provider as well.   To learn more about what you can do with MyChart, go to [email or web address].    Your next appointment:   4 month(s)  The format for your next appointment:   In Person  Provider:   Lamar Fitch, MD    Other Instructions NA

## 2024-07-12 LAB — PRO B NATRIURETIC PEPTIDE: NT-Pro BNP: 268 pg/mL (ref 0–376)

## 2024-07-12 LAB — BASIC METABOLIC PANEL WITH GFR
BUN/Creatinine Ratio: 13 (ref 10–24)
BUN: 16 mg/dL (ref 8–27)
CO2: 22 mmol/L (ref 20–29)
Calcium: 10 mg/dL (ref 8.6–10.2)
Chloride: 101 mmol/L (ref 96–106)
Creatinine, Ser: 1.24 mg/dL (ref 0.76–1.27)
Glucose: 95 mg/dL (ref 70–99)
Potassium: 4.6 mmol/L (ref 3.5–5.2)
Sodium: 138 mmol/L (ref 134–144)
eGFR: 63 mL/min/1.73 (ref >59)

## 2024-07-17 ENCOUNTER — Ambulatory Visit: Payer: Self-pay | Admitting: Cardiology

## 2024-07-18 ENCOUNTER — Telehealth: Payer: Self-pay

## 2024-07-18 NOTE — Telephone Encounter (Signed)
 Left message on My Chart with lab results per Dr. Karry note. Routed to PCP

## 2024-07-19 ENCOUNTER — Telehealth: Payer: Self-pay

## 2024-07-19 NOTE — Telephone Encounter (Signed)
 Pt viewed labs on My Chart per Dr. Karry note. Routed to PCP.

## 2024-08-07 DIAGNOSIS — R002 Palpitations: Secondary | ICD-10-CM

## 2024-08-11 MED ORDER — METOPROLOL SUCCINATE ER 50 MG PO TB24: 50.0000 mg | ORAL_TABLET | Freq: Every day | ORAL | 3 refills | Status: AC

## 2024-08-30 ENCOUNTER — Other Ambulatory Visit: Payer: Self-pay | Admitting: Internal Medicine

## 2024-10-11 ENCOUNTER — Encounter: Payer: Self-pay | Admitting: Internal Medicine

## 2024-10-13 ENCOUNTER — Encounter: Payer: Self-pay | Admitting: Internal Medicine

## 2024-10-14 ENCOUNTER — Other Ambulatory Visit: Payer: Self-pay | Admitting: Internal Medicine

## 2024-11-13 ENCOUNTER — Ambulatory Visit: Admitting: Cardiology

## 2024-11-21 ENCOUNTER — Encounter

## 2024-12-05 ENCOUNTER — Encounter: Admitting: Internal Medicine

## 2024-12-26 ENCOUNTER — Ambulatory Visit: Admitting: Internal Medicine
